# Patient Record
Sex: Female | Born: 1985 | Hispanic: Yes | Marital: Married | Smoking: Former smoker
Health system: Southern US, Community
[De-identification: ages and names within clinical notes are randomized; demographics above are authoritative.]

## PROBLEM LIST (undated history)

## (undated) DIAGNOSIS — F419 Anxiety disorder, unspecified: Secondary | ICD-10-CM

## (undated) DIAGNOSIS — F32A Depression, unspecified: Secondary | ICD-10-CM

## (undated) HISTORY — PX: NO PAST SURGERIES: SHX2092

---

## 2020-09-01 NOTE — L&D Delivery Note (Signed)
OB/GYN Faculty Practice Delivery Note  Sue Moore is a 35 y.o. G3P0020 s/p VD at [redacted]w[redacted]d. She was admitted for IOL d/t PostDates.   ROM: 7h 52m with clear fluid GBS Status: Negative Maximum Maternal Temperature: 99.9  Labor Progress: Patient admitted for induction and was given one dose of Cytotec before being started on pitocin.  She received an epidural and was AROM'd at 7.5 cm.  She then progressed to complete and delivered as below with staff and SO support.   Delivery Date/Time: August 20, 2021 at 0125 Delivery: Called to room and patient was complete and pushing. After 2 hours of pushing, head delivered in ROA with manual restitution to ROT. No nuchal cord present. Shoulder and body delivered in usual fashion. Infant without spontaneous cry, but good HR, tone, and color.  Infant dried and stimulated by provider before being placed on mother's abdomen where nurse's continued drying and stimulating. Cord clamped x 2 after 4-minute delay, and cut by father-Sue Moore. Cord blood drawn. Placenta delivered spontaneously with gentle cord traction. Fundus firm with massage and Pitocin. Labia, perineum, vagina, and cervix inspected and revealed a 2nd degree perineal laceration.  It was repaired with 3-0 vicryl on CT-1 and SH.  No additional anesthetic necessary and patient tolerated the procedure well. Fundus firm, at the umbilicus, and bleeding small.  Mother hemodynamically stable and infant skin to skin, with father, prior to provider exit.  Mother desires pills for birth control and opts to breastfeed.      Placenta: Intact, Shultz, Disposal Complications: None Lacerations: 2nd Degree Perineal EBL: Analgesia: Epidural  Infant: Female-Sue Moore 8, 9   3317g, 7lbs 5oz, 20in  Cherre Robins, CNM  08/20/2021 2:09 AM

## 2020-12-19 ENCOUNTER — Encounter (HOSPITAL_COMMUNITY): Payer: Self-pay | Admitting: *Deleted

## 2020-12-19 ENCOUNTER — Inpatient Hospital Stay (HOSPITAL_COMMUNITY)
Admission: AD | Admit: 2020-12-19 | Discharge: 2020-12-19 | Disposition: A | Discharge status: Home / Self Care | Payer: Self-pay | Attending: Obstetrics and Gynecology | Admitting: Obstetrics and Gynecology

## 2020-12-19 ENCOUNTER — Inpatient Hospital Stay (HOSPITAL_COMMUNITY): Payer: Self-pay

## 2020-12-19 DIAGNOSIS — Z758 Other problems related to medical facilities and other health care: Secondary | ICD-10-CM

## 2020-12-19 DIAGNOSIS — Z789 Other specified health status: Secondary | ICD-10-CM

## 2020-12-19 DIAGNOSIS — O99340 Other mental disorders complicating pregnancy, unspecified trimester: Secondary | ICD-10-CM | POA: Insufficient documentation

## 2020-12-19 DIAGNOSIS — Z3A01 Less than 8 weeks gestation of pregnancy: Secondary | ICD-10-CM | POA: Insufficient documentation

## 2020-12-19 DIAGNOSIS — F419 Anxiety disorder, unspecified: Secondary | ICD-10-CM | POA: Insufficient documentation

## 2020-12-19 DIAGNOSIS — Z6791 Unspecified blood type, Rh negative: Secondary | ICD-10-CM

## 2020-12-19 DIAGNOSIS — O99321 Drug use complicating pregnancy, first trimester: Secondary | ICD-10-CM | POA: Insufficient documentation

## 2020-12-19 DIAGNOSIS — F1290 Cannabis use, unspecified, uncomplicated: Secondary | ICD-10-CM

## 2020-12-19 DIAGNOSIS — Z87891 Personal history of nicotine dependence: Secondary | ICD-10-CM | POA: Insufficient documentation

## 2020-12-19 DIAGNOSIS — Z3A00 Weeks of gestation of pregnancy not specified: Secondary | ICD-10-CM

## 2020-12-19 DIAGNOSIS — O219 Vomiting of pregnancy, unspecified: Secondary | ICD-10-CM

## 2020-12-19 DIAGNOSIS — O3680X0 Pregnancy with inconclusive fetal viability, not applicable or unspecified: Secondary | ICD-10-CM

## 2020-12-19 DIAGNOSIS — O26899 Other specified pregnancy related conditions, unspecified trimester: Secondary | ICD-10-CM

## 2020-12-19 HISTORY — DX: Anxiety disorder, unspecified: F41.9

## 2020-12-19 HISTORY — DX: Depression, unspecified: F32.A

## 2020-12-19 LAB — URINALYSIS, ROUTINE W REFLEX MICROSCOPIC
Bilirubin Urine: NEGATIVE
Glucose, UA: NEGATIVE mg/dL
Hgb urine dipstick: NEGATIVE
Ketones, ur: 20 mg/dL — AB
Leukocytes,Ua: NEGATIVE
Nitrite: NEGATIVE
Protein, ur: NEGATIVE mg/dL
Specific Gravity, Urine: 1.006 (ref 1.005–1.030)
pH: 6 (ref 5.0–8.0)

## 2020-12-19 LAB — COMPREHENSIVE METABOLIC PANEL
ALT: 12 U/L (ref 0–44)
AST: 15 U/L (ref 15–41)
Albumin: 4.3 g/dL (ref 3.5–5.0)
Alkaline Phosphatase: 43 U/L (ref 38–126)
Anion gap: 10 (ref 5–15)
BUN: 8 mg/dL (ref 6–20)
CO2: 21 mmol/L — ABNORMAL LOW (ref 22–32)
Calcium: 9.2 mg/dL (ref 8.9–10.3)
Chloride: 105 mmol/L (ref 98–111)
Creatinine, Ser: 0.53 mg/dL (ref 0.44–1.00)
GFR, Estimated: >60 mL/min (ref >60)
Glucose, Bld: 89 mg/dL (ref 70–99)
Potassium: 3.5 mmol/L (ref 3.5–5.1)
Sodium: 136 mmol/L (ref 135–145)
Total Bilirubin: 1 mg/dL (ref 0.3–1.2)
Total Protein: 7.3 g/dL (ref 6.5–8.1)

## 2020-12-19 LAB — CBC
HCT: 39.6 % (ref 36.0–46.0)
Hemoglobin: 13.5 g/dL (ref 12.0–15.0)
MCH: 30.7 pg (ref 26.0–34.0)
MCHC: 34.1 g/dL (ref 30.0–36.0)
MCV: 90 fL (ref 80.0–100.0)
Platelets: 376 10*3/uL (ref 150–400)
RBC: 4.4 MIL/uL (ref 3.87–5.11)
RDW: 12.4 % (ref 11.5–15.5)
WBC: 11.7 10*3/uL — ABNORMAL HIGH (ref 4.0–10.5)
nRBC: 0 % (ref 0.0–0.2)

## 2020-12-19 LAB — HCG, QUANTITATIVE, PREGNANCY: hCG, Beta Chain, Quant, S: 13250 m[IU]/mL — ABNORMAL HIGH (ref <5)

## 2020-12-19 LAB — POCT PREGNANCY, URINE: Preg Test, Ur: POSITIVE — AB

## 2020-12-19 LAB — RAPID URINE DRUG SCREEN, HOSP PERFORMED
Amphetamines: NOT DETECTED
Barbiturates: NOT DETECTED
Benzodiazepines: NOT DETECTED
Cocaine: NOT DETECTED
Opiates: NOT DETECTED
Tetrahydrocannabinol: POSITIVE — AB

## 2020-12-19 LAB — ABO/RH: ABO/RH(D): AB NEG

## 2020-12-19 MED ORDER — LACTATED RINGERS IV BOLUS
1000.0000 mL | Freq: Once | INTRAVENOUS | Status: AC
  Administered 2020-12-19: 1000 mL via INTRAVENOUS

## 2020-12-19 MED ORDER — HYDROXYZINE HCL 25 MG PO TABS: 25.0000 mg | ORAL_TABLET | Freq: Four times a day (QID) | ORAL | 0 refills | Status: DC

## 2020-12-19 MED ORDER — PROMETHAZINE HCL 12.5 MG PO TABS: 12.5000 mg | ORAL_TABLET | Freq: Four times a day (QID) | ORAL | 0 refills | Status: DC | PRN

## 2020-12-19 MED ORDER — HYDROXYZINE HCL 50 MG/ML IM SOLN
25.0000 mg | INTRAMUSCULAR | Status: DC | PRN
  Administered 2020-12-19: 25 mg via INTRAMUSCULAR
  Filled 2020-12-19 (×3): qty 0.5

## 2020-12-19 NOTE — Discharge Instructions (Signed)
Sndrome de hipermesis cannabinoide Cannabinoid Hyperemesis Syndrome El sndrome de hipermesis cannabinoide [facility]) es una afeccin que causa nuseas, vmitos y dolor abdominal repetidos despus del consumo prolongado (crnico) de marihuana (cannabis). Las Dealer con SHC comnmente consumen marihuana de 3 a 5 veces por da, durante muchos aos, antes de presentar sntomas, aunque es posible Publishing copy SHC con un consumo diario Chief Operating Officer. Los sntomas del [facility] pueden ser leves al principio, pero pueden empeoran y volverse ms frecuentes. En algunos casos, el SHC puede causar vmitos intensos diarios, lo que puede provocar prdida de peso y deshidratacin. Cules son las causas? Se desconoce la causa exacta de esta afeccin. El uso prolongado de marihuana puede sobreestimular ciertas protenas en el cerebro y el tracto gastrointestinal que reaccionan con sustancias qumicas presentes en la marihuana (receptores cannabinoides). Esta sobreestimulacin puede ser la causa del [facility]. Cules son los signos o sntomas? Los sntomas de Copy suelen ser leves durante los primeros episodios, pero pueden empeorar con el [place]. Entre los sntomas, se pueden incluir los siguientes:  Nuseas frecuentes, Public librarian las primeras horas de la [place].  Vmitos. Estos pueden volverse intensos.  Dolor abdominal.  Sensacin de mucho cansancio (letargo).  Dolores de [place]. El [facility] puede desaparecer y volver a Biomedical engineer (ser recurrente). Las personas pueden no tener sntomas o bien pueden estar saludables entre episodios del [facility]. Tomar duchas calientes puede [employer] sntomas del SHC, por lo que sentir la necesidad de tomar varias duchas calientes durante el da puede ser un signo de esta afeccin. Cmo se diagnostica? Esta afeccin se puede diagnosticar en funcin de lo siguiente:  Los sntomas y antecedentes mdicos, incluido el consumo de drogas.  Un examen fsico. Es posible  que le hagan estudios para descartar otros problemas que podran ser la causa de los sntomas. Estas pruebas pueden incluir lo siguiente:  Anlisis de sangre.  Anlisis de [place].  Estudios de diagnstico por imgenes, como una radiografa o una exploracin por tomografa computarizada (TC). Cmo se trata? El tratamiento de esta afeccin incluye interrumpir el consumo de marihuana. El tratamiento puede incluir:  Un programa de rehabilitacin de la drogadiccin, si tiene problemas para dejar el consumo de marihuana.  Medicamentos para las nuseas. Estos pueden administrarse en el hospital a travs de una va intravenosa (i.v.) introducida en una vena, o bien pueden ser medicamentos que se toman por boca (va oral).  Ciertas cremas que contienen una sustancia llamada capsaicina. Estas pueden mejorar los sntomas cuando se aplican en el abdomen.  Duchas calientes para [employer]. En los casos intensos, puede ser necesario recibir tratamiento en un hospital. Es posible que le administren lquidos intravenosos (i.v.) para prevenir o Medical illustrator, as como medicamentos para tratar las nuseas, los vmitos y Chief Technology Officer. Siga estas instrucciones en su casa: Durante un episodio de [facility]  Permanezca en la cama y descanse en una habitacin oscura y tranquila.  Tome los medicamentos para las nuseas como se lo haya indicado el mdico.  Trate de tomar duchas calientes para [employer] sntomas.   Despus de un episodio de SHC  Beba pequeas cantidades de lquidos claros lentamente. Aumente gradualmente la cantidad si puede retener los lquidos sin vomitar.  Una vez que pueda comer sin vomitar, coma alimentos blandos en pequeas cantidades cada 3 o 4 horas. Instrucciones generales  No consuma ningn producto que contenga marihuana.Si necesita ayuda para dejar de consumir marihuana, consulte al [employer] recursos y opciones de [place].  Beba suficiente  lquido como para  Pharmacologist la orina de color amarillo plido. Evite consumir lquidos que contengan mucha azcar o cafena, como caf y bebidas gaseosas.  Tome y [employer] medicamentos de venta libre y los recetados solamente como se lo haya indicado el mdico. Consulte al mdico antes de Corporate investment banker a Dietitian o de iniciar un tratamiento nuevo.  Concurra a todas las visitas de [address] se lo haya indicado el mdico. Esto es importante. Esto incluye los programas recomendados para el abuso de sustancias.   Comunquese con un mdico si:  Sus sntomas empeoran.  No puede beber lquidos sin vomitar o sentir dolor intenso.  Siente dolor y tiene problemas para tragar despus de un episodio. Busque ayuda de inmediato si:  No puede parar de vomitar.  Vomita sangre o el vmito es parecido a los granos de caf.  Siente un dolor abdominal intenso.  Tiene heces con sangre o que son de color negro, o tienen aspecto alquitranado.  Tiene sntomas de deshidratacin, por ejemplo: ? Ojos hundidos. ? Incapacidad de producir lgrimas. ? Labios agrietados. ? Sequedad de boca. ? Disminucin de la produccin de [place]. ? Debilidad. ? Somnolencia. ? Mareos, sensacin de desvanecimiento o desmayos. Resumen  El sndrome de hipermesis cannabinoide [facility]) es una afeccin que causa nuseas, vmitos y dolor abdominal repetidos despus del consumo prolongado de marihuana.  Las Dealer con SHC comnmente consumen marihuana de 3 a 5 veces por da, durante muchos aos, antes de presentar sntomas, aunque es posible Publishing copy SHC con un consumo diario Chief Operating Officer.  El tratamiento de esta afeccin incluye interrumpir el consumo de marihuana. Las duchas calientes y las cremas con capsaicina tambin pueden ayudar a Paramedic los sntomas. Consulte al mdico antes de empezar a usar cualquier [employer] o de iniciar otros tratamientos.  El mdico podr recetarle medicamentos para [employer]  nuseas.  Solicite ayuda de inmediato si tiene signos de deshidratacin, como sequedad en la boca, disminucin de la produccin de [place], [place], [place] y aturdimiento. Esta informacin no tiene Theme park manager el consejo del mdico. Asegrese de hacerle al mdico cualquier pregunta que tenga. Document Revised: 08/31/2019 Document Reviewed: 08/31/2019 Elsevier Patient Education  2021 [employer].

## 2020-12-19 NOTE — MAU Provider Note (Signed)
History     CSN: [id number]  Arrival date and time: 12/19/20 1147   Event Date/Time   First Provider Initiated Contact with Patient 12/19/20 [phone]      Chief Complaint  Patient presents with  . Anxiety  . Depression  . Emesis  . Possible Pregnancy   HPI Sue Moore is a 35 y.o. G3P0020 at Unknown who presents to MAU with chief complaints of anxiety and abdominal pain. Her abdominal pain started last week. Her anxiety is chronic. Patient states her anxiety is currently so severe she cannot tell if her pain is in her abdomen or from her anxiety. She denies ever taking medication for her anxiety.  Patient also experiences lack of appetite and intermittent vomiting related to high anxiety. She previously managed this by smoking THC, but discontinued this when she had a positive pregnancy test last week. She has instead started using a Delta 8 pen but states her vomiting is worse.   Patient denies vaginal bleeding, dysuria, fever or recent illness.   OB History    Gravida  3   Para      Term      Preterm      AB  2   Living        SAB  2   IAB      Ectopic      Multiple      Live Births              Past Medical History:  Diagnosis Date  . Anxiety   . Depression     Past Surgical History:  Procedure Laterality Date  . NO PAST SURGERIES      History reviewed. No pertinent family history.  Social History   Tobacco Use  . Smoking status: Former Smoker  Substance Use Topics  . Alcohol use: Not Currently  . Drug use: Not Currently    Types: Marijuana    Comment: was daily user in [place]    Allergies: No Known Allergies  No medications prior to admission.    Review of Systems  Constitutional: Positive for appetite change and fatigue.  Respiratory: Negative for shortness of breath.   Cardiovascular: Negative for palpitations.  Gastrointestinal: Positive for abdominal pain.  Psychiatric/Behavioral: The patient is  nervous/anxious.   All other systems reviewed and are negative.  Physical Exam   Blood pressure 123/78, pulse (!) 107, temperature 98.6 F (37 C), temperature source Oral, resp. rate (!) 22, height 4' 11.06" (1.5 m), weight 49.8 kg, last menstrual period 11/21/2020, SpO2 100 %.  Physical Exam Vitals and nursing note reviewed. Exam conducted with a chaperone present.  Constitutional:      General: She is in acute distress.     Appearance: Normal appearance.  Cardiovascular:     Rate and Rhythm: Tachycardia present.     Pulses: Normal pulses.     Heart sounds: Normal heart sounds.  Pulmonary:     Effort: Pulmonary effort is normal.     Breath sounds: Normal breath sounds.  Abdominal:     General: Abdomen is flat. Bowel sounds are normal.     Tenderness: There is no abdominal tenderness. There is no right CVA tenderness or left CVA tenderness.  Skin:    Capillary Refill: Capillary refill takes less than 2 seconds.  Neurological:     Mental Status: She is alert and oriented to person, place, and time.  Psychiatric:        Mood and Affect: Mood  normal.        Behavior: Behavior normal.        Thought Content: Thought content normal.        Judgment: Judgment normal.     MAU Course  Procedures  --Patient assessed prior to arrival of husband. Extremely agitated, shaking in bed but A&) x 4, participating in care and responding appropriately. Denies SI, HI, IPV  Orders Placed This Encounter  Procedures  . US OB LESS THAN 14 WEEKS WITH OB TRANSVAGINAL  . Urinalysis, Routine w reflex microscopic Urine, Clean Catch  . CBC  . Comprehensive metabolic panel  . hCG, quantitative, pregnancy  . Rapid urine drug screen (hospital performed)  . Pregnancy, urine POC  . ABO/Rh  . Insert peripheral IV  . Discharge patient   Patient Vitals for the past 24 hrs:  BP Temp Temp src Pulse Resp SpO2 Height Weight  12/19/20 1549 (!) 106/55 98.4 F (36.9 C) Oral 86 17 97 % -- --  12/19/20  1209 123/78 98.6 F (37 C) Oral (!) 107 (!) 22 100 % 4' 11.06" (1.5 m) 49.8 kg   Results for orders placed or performed during the hospital encounter of 12/19/20 (from the past 24 hour(s))  Pregnancy, urine POC     Status: Abnormal   Collection Time: 12/19/20 12:16 PM  Result Value Ref Range   Preg Test, Ur POSITIVE (A) NEGATIVE  Urinalysis, Routine w reflex microscopic Urine, Clean Catch     Status: Abnormal   Collection Time: 12/19/20 12:21 PM  Result Value Ref Range   Color, Urine YELLOW YELLOW   APPearance HAZY (A) CLEAR   Specific Gravity, Urine 1.006 1.005 - 1.030   pH 6.0 5.0 - 8.0   Glucose, UA NEGATIVE NEGATIVE mg/dL   Hgb urine dipstick NEGATIVE NEGATIVE   Bilirubin Urine NEGATIVE NEGATIVE   Ketones, ur 20 (A) NEGATIVE mg/dL   Protein, ur NEGATIVE NEGATIVE mg/dL   Nitrite NEGATIVE NEGATIVE   Leukocytes,Ua NEGATIVE NEGATIVE  CBC     Status: Abnormal   Collection Time: 12/19/20 12:42 PM  Result Value Ref Range   WBC 11.7 (H) 4.0 - 10.5 K/uL   RBC 4.40 3.87 - 5.11 MIL/uL   Hemoglobin 13.5 12.0 - 15.0 g/dL   HCT 33.0 07.6 - 22.6 %   MCV 90.0 80.0 - 100.0 fL   MCH 30.7 26.0 - 34.0 pg   MCHC 34.1 30.0 - 36.0 g/dL   RDW 33.3 54.5 - 62.5 %   Platelets 376 150 - 400 K/uL   nRBC 0.0 0.0 - 0.2 %  Comprehensive metabolic panel     Status: Abnormal   Collection Time: 12/19/20 12:42 PM  Result Value Ref Range   Sodium 136 135 - 145 mmol/L   Potassium 3.5 3.5 - 5.1 mmol/L   Chloride 105 98 - 111 mmol/L   CO2 21 (L) 22 - 32 mmol/L   Glucose, Bld 89 70 - 99 mg/dL   BUN 8 6 - 20 mg/dL   Creatinine, Ser 6.38 0.44 - 1.00 mg/dL   Calcium 9.2 8.9 - 93.7 mg/dL   Total Protein 7.3 6.5 - 8.1 g/dL   Albumin 4.3 3.5 - 5.0 g/dL   AST 15 15 - 41 U/L   ALT 12 0 - 44 U/L   Alkaline Phosphatase 43 38 - 126 U/L   Total Bilirubin 1.0 0.3 - 1.2 mg/dL   GFR, Estimated >34 >28 mL/min   Anion gap 10 5 - 15  ABO/Rh  Status: None   Collection Time: 12/19/20 12:42 PM  Result Value Ref  Range   ABO/RH(D)      AB NEG Performed at [facility], [address] [place], [place] [address]   hCG, quantitative, pregnancy     Status: Abnormal   Collection Time: 12/19/20 12:42 PM  Result Value Ref Range   hCG, Beta Chain, Quant, S 13,250 (H) <5 mIU/mL  Rapid urine drug screen (hospital performed)     Status: Abnormal   Collection Time: 12/19/20  2:37 PM  Result Value Ref Range   Opiates NONE DETECTED NONE DETECTED   Cocaine NONE DETECTED NONE DETECTED   Benzodiazepines NONE DETECTED NONE DETECTED   Amphetamines NONE DETECTED NONE DETECTED   Tetrahydrocannabinol POSITIVE (A) NONE DETECTED   Barbiturates NONE DETECTED NONE DETECTED   US OB LESS THAN 14 WEEKS WITH OB TRANSVAGINAL  Result Date: 12/19/2020 CLINICAL DATA:  Pregnancy of unknown anatomic location, LMP 11/21/2020 EXAM: OBSTETRIC <14 WK [place] AND TRANSVAGINAL OB US TECHNIQUE: Both transabdominal and transvaginal ultrasound examinations were performed for complete evaluation of the gestation as well as the maternal uterus, adnexal regions, and pelvic cul-de-sac. Transvaginal technique was performed to assess early pregnancy. COMPARISON:  None FINDINGS: Intrauterine gestational sac: Present, single Yolk sac:  Not identified Embryo:  Not identified Cardiac Activity: N/A Heart Rate: N/A  bpm MSD: 6.6 mm   5 w   2 d Subchorionic hemorrhage:  None visualized. Maternal uterus/adnexae: Remainder of uterus unremarkable. RIGHT ovary normal size and morphology 3.4 x 2.2 x 1.8 cm. LEFT ovary normal size and morphology 3.7 x 2.1 x 2.3 cm. Small amount of free pelvic fluid in cul-de-sac. No adnexal masses. IMPRESSION: Gestational sac within the uterus. No fetal pole identified to establish viability; may consider follow-up ultrasound in 14 days to establish viability if clinically indicated. Small amount of nonspecific free pelvic fluid without evidence of adnexal mass. Electronically Signed   By: Ulyses Southward M.D.   On: 12/19/2020 15:05    Meds ordered this encounter  Medications  . lactated ringers bolus 1,000 mL  . hydrOXYzine (VISTARIL) injection 25 mg  . hydrOXYzine (ATARAX/VISTARIL) 25 MG tablet    Sig: Take 1 tablet (25 mg total) by mouth every 6 (six) hours.    Dispense:  12 tablet    Refill:  0    Order Specific Question:   Supervising Provider    Answer:   ERVIN, MICHAEL L [[id number]]  . promethazine (PHENERGAN) 12.5 MG tablet    Sig: Take 1 tablet (12.5 mg total) by mouth every 6 (six) hours as needed for nausea or vomiting.    Dispense:  30 tablet    Refill:  0    Order Specific Question:   Supervising Provider    Answer:   Nettie Elm L [[id number]]    Assessment and Plan  --35 y.o. [id number] with PUL --Anxiety improved with Vistaril --+ THC --Given GoodRx printout to reduce out of pocket cost  --Declined SW consult, TTS --Language barrier: in-person interpreters utilized for all patient interaction --Discharge home in stable condition  F/U: --Stat Quant hCG in 48 hours. Self pay and so will accomplish in MAU  Calvert Cantor, CNM 12/19/2020, 7:07 PM

## 2020-12-19 NOTE — MAU Note (Signed)
She is preg, having a lot of anxiety, depression and is not feeling.  Is not able to eat because of stress, has been vomiting. Is very tense. Is unsure if she is really having pain or if it just from tension. In [place], was smoking marijuana to treat anxiety and depression.  Came to [place] in Oct.  Has been on similar medication  (adelpha 8). Stopped using when found out pregnant. (last wk).  3rd preg- has had 2 prior miscarriages.

## 2020-12-24 ENCOUNTER — Telehealth: Payer: Self-pay | Admitting: Women's Health

## 2020-12-24 NOTE — Telephone Encounter (Signed)
Attempted to call patient with Spanish interpreter for missed hCG visit on 12/21/2020. Phone rang, no answer, voicemail message with female voice and name played, no message left. Patient does not have MyChart for messaging.  Marylen Ponto, NP  1:18 PM 12/24/2020

## 2021-02-07 ENCOUNTER — Other Ambulatory Visit: Payer: Self-pay

## 2021-02-07 ENCOUNTER — Ambulatory Visit (INDEPENDENT_AMBULATORY_CARE_PROVIDER_SITE_OTHER): Payer: BC Managed Care – PPO

## 2021-02-07 ENCOUNTER — Encounter: Payer: Self-pay | Admitting: Obstetrics and Gynecology

## 2021-02-07 ENCOUNTER — Other Ambulatory Visit (HOSPITAL_COMMUNITY)
Admission: RE | Admit: 2021-02-07 | Discharge: 2021-02-07 | Disposition: A | Discharge status: Home / Self Care | Payer: BC Managed Care – PPO | Source: Ambulatory Visit | Attending: Obstetrics and Gynecology | Admitting: Obstetrics and Gynecology

## 2021-02-07 ENCOUNTER — Ambulatory Visit (INDEPENDENT_AMBULATORY_CARE_PROVIDER_SITE_OTHER): Payer: BC Managed Care – PPO | Admitting: Obstetrics and Gynecology

## 2021-02-07 VITALS — BP 91/56 | HR 64 | Wt 110.0 lb

## 2021-02-07 DIAGNOSIS — O3680X0 Pregnancy with inconclusive fetal viability, not applicable or unspecified: Secondary | ICD-10-CM

## 2021-02-07 DIAGNOSIS — Z3A12 12 weeks gestation of pregnancy: Secondary | ICD-10-CM

## 2021-02-07 DIAGNOSIS — Z3481 Encounter for supervision of other normal pregnancy, first trimester: Secondary | ICD-10-CM | POA: Diagnosis not present

## 2021-02-07 DIAGNOSIS — O0990 Supervision of high risk pregnancy, unspecified, unspecified trimester: Secondary | ICD-10-CM | POA: Insufficient documentation

## 2021-02-07 DIAGNOSIS — Z789 Other specified health status: Secondary | ICD-10-CM

## 2021-02-07 DIAGNOSIS — Z3401 Encounter for supervision of normal first pregnancy, first trimester: Secondary | ICD-10-CM

## 2021-02-07 DIAGNOSIS — F1290 Cannabis use, unspecified, uncomplicated: Secondary | ICD-10-CM

## 2021-02-07 DIAGNOSIS — F32A Depression, unspecified: Secondary | ICD-10-CM

## 2021-02-07 DIAGNOSIS — O0992 Supervision of high risk pregnancy, unspecified, second trimester: Secondary | ICD-10-CM | POA: Diagnosis not present

## 2021-02-07 DIAGNOSIS — O09521 Supervision of elderly multigravida, first trimester: Secondary | ICD-10-CM

## 2021-02-07 DIAGNOSIS — O26891 Other specified pregnancy related conditions, first trimester: Secondary | ICD-10-CM

## 2021-02-07 DIAGNOSIS — R630 Anorexia: Secondary | ICD-10-CM

## 2021-02-07 DIAGNOSIS — F419 Anxiety disorder, unspecified: Secondary | ICD-10-CM

## 2021-02-07 DIAGNOSIS — Z6791 Unspecified blood type, Rh negative: Secondary | ICD-10-CM

## 2021-02-07 DIAGNOSIS — F502 Bulimia nervosa: Secondary | ICD-10-CM

## 2021-02-07 NOTE — Progress Notes (Signed)
Last 2019-states was positive HPV   Patient informed that the ultrasound is considered a limited obstetric ultrasound and is not intended to be a complete ultrasound exam.  Patient also informed that the ultrasound is not being completed with the intent of assessing for fetal or placental anomalies or any pelvic abnormalities. Explained that the purpose of today's ultrasound is to assess for viability.  Patient acknowledges the purpose of the exam and the limitations of the study.

## 2021-02-08 ENCOUNTER — Encounter: Payer: Self-pay | Admitting: Obstetrics and Gynecology

## 2021-02-08 DIAGNOSIS — F32A Depression, unspecified: Secondary | ICD-10-CM | POA: Insufficient documentation

## 2021-02-08 DIAGNOSIS — R630 Anorexia: Secondary | ICD-10-CM | POA: Insufficient documentation

## 2021-02-08 DIAGNOSIS — F502 Bulimia nervosa: Secondary | ICD-10-CM | POA: Insufficient documentation

## 2021-02-08 DIAGNOSIS — O09523 Supervision of elderly multigravida, third trimester: Secondary | ICD-10-CM | POA: Insufficient documentation

## 2021-02-08 LAB — CYTOLOGY - PAP
Chlamydia: NEGATIVE
Comment: NEGATIVE
Comment: NEGATIVE
Comment: NORMAL
Diagnosis: NEGATIVE
High risk HPV: NEGATIVE
Neisseria Gonorrhea: NEGATIVE

## 2021-02-08 MED ORDER — DOXYLAMINE-PYRIDOXINE 10-10 MG PO TBEC: 2.0000 | DELAYED_RELEASE_TABLET | Freq: Every day | ORAL | 5 refills | Status: DC

## 2021-02-08 NOTE — Progress Notes (Signed)
New OB Note  02/07/2021   Clinic: [facility]  Chief Complaint: New OB  Transfer of Care Patient: no  History of Present Illness: Ms. Bale is a 35 y.o. G3P0020 @ 12/4 weeks (EDC 12/18, based on Patient's last menstrual period was 11/11/2020.=12wk u/).  Preg complicated by has Rh negative status during pregnancy; Marijuana use; Pregnancy of unknown anatomic location; Language barrier affecting health care; and Encounter for supervision of normal first pregnancy in first trimester on their problem list.   She has Negative signs or symptoms of nausea/vomiting of pregnancy. She has Negative signs or symptoms of miscarriage or preterm labor  ROS: A 12-point review of systems was performed and negative, except as stated in the above HPI.  OBGYN History: As per HPI. OB History  Gravida Para Term Preterm AB Living  3       2    SAB IAB Ectopic Multiple Live Births  2            # Outcome Date GA Lbr Len/2nd Weight Sex Delivery Anes PTL Lv  3 Current           2 SAB 2016          1 SAB 2011       Y      Past Medical History: Past Medical History:  Diagnosis Date   Anxiety    Depression     Past Surgical History: Past Surgical History:  Procedure Laterality Date   NO PAST SURGERIES      Family History:  History reviewed. No pertinent family history.  Social History:  Social History   Socioeconomic History   Marital status: Married    Spouse name: Not on file   Number of children: Not on file   Years of education: Not on file   Highest education level: Not on file  Occupational History   Not on file  Tobacco Use   Smoking status: Former    Pack years: 0.00   Smokeless tobacco: Not on file  Vaping Use   Vaping Use: Not on file  Substance and Sexual Activity   Alcohol use: Not Currently   Drug use: Not Currently    Types: Marijuana    Comment: was daily user in [place]   Sexual activity: Yes  Other Topics Concern   Not on  file  Social History Narrative   Not on file   Social Determinants of Health   Financial Resource Strain: Not on file  Food Insecurity: Not on file  Transportation Needs: Not on file  Physical Activity: Not on file  Stress: Not on file  Social Connections: Not on file  Intimate Partner Violence: Not on file    Allergy: No Known Allergies   Current Outpatient Medications: Prenatal vitamins, phenergan  Physical Exam:   BP (!) 91/56   Pulse 64   Wt 110 lb (49.9 kg)   LMP 11/11/2020   BMI 22.18 kg/m  Body mass index is 22.18 kg/m. Contractions: Not present Vag. Bleeding: None. FHTs: 150s  General: no acute distress Cardiovascular: S1, S2 normal, no murmur, rub or gallop, regular rate and rhythm Respiratory:  Clear to auscultation bilateral. Normal respiratory effort Abdomen: positive bowel sounds and no masses, hernias; diffusely non tender to palpation, non distended Breasts: breasts appear normal, no suspicious masses, no skin or nipple changes or axillary nodes, and normal palpation. Neuro/Psych:  Normal mood and affect.  Skin:  Warm and dry.  Lymphatic:  No  inguinal lymphadenopathy.   Pelvic exam: is not limited by body habitus EGBUS: within normal limits, Vagina: within normal limits and with no blood in the vault, Cervix: normal appearing cervix without discharge or lesions, closed/long/high, Uterus:  enlarged, c/w 12-14 week size, and Adnexa:  normal adnexa and no mass, fullness, tenderness  Laboratory: No new labs  Imaging:  Bedside u/s with SLIUP at 12/3 based on CRL   Assessment: pt doing well  Plan: 1. Pregnancy of unknown anatomic location - US OB Limited; Future  2. Encounter for supervision of normal first pregnancy in first trimester  3. Multigravida of advanced maternal age in first trimester Pt amenable to genetics. Cffdna today. Offer afp nv  4. Rh negative status during pregnancy in first trimester Rhogam at 28wks and PP PRN  5.  Marijuana use counseled  6. Language barrier affecting health care Interpreter used  7. Supervision of high risk pregnancy in second trimester Routine care - Cytology - PAP - Culture, OB Urine - Genetic Screening - US MFM OB DETAIL +14 WK; Future - CBC/D/Plt+RPR+Rh+ABO+RubIgG... - TSH - Hemoglobin A1c - Comprehensive metabolic panel  8. Anxiety disorder, unspecified type Doing well. Continue to follow closely. Refer to [facility] PRN  Problem list reviewed and updated.  Follow up in 3 weeks.  The nature of [facility] with multiple MDs and other Advanced Practice Providers was explained to patient; also emphasized that residents, students are part of our team.  >50% of 30 min visit spent on counseling and coordination of care.     Cornelia Copa MD Attending Center for [facility])

## 2021-02-09 LAB — CBC/D/PLT+RPR+RH+ABO+RUBIGG...
Antibody Screen: NEGATIVE
Basophils Absolute: 0 10*3/uL (ref 0.0–0.2)
Basos: 0 %
EOS (ABSOLUTE): 0.1 10*3/uL (ref 0.0–0.4)
Eos: 1 %
HCV Ab: <0.1 s/co ratio (ref 0.0–0.9)
HIV Screen 4th Generation wRfx: NONREACTIVE
Hematocrit: 35.3 % (ref 34.0–46.6)
Hemoglobin: 12.6 g/dL (ref 11.1–15.9)
Hepatitis B Surface Ag: NEGATIVE
Immature Grans (Abs): 0.1 10*3/uL (ref 0.0–0.1)
Immature Granulocytes: 1 %
Lymphocytes Absolute: 1.8 10*3/uL (ref 0.7–3.1)
Lymphs: 19 %
MCH: 32 pg (ref 26.6–33.0)
MCHC: 35.7 g/dL (ref 31.5–35.7)
MCV: 90 fL (ref 79–97)
Monocytes Absolute: 0.4 10*3/uL (ref 0.1–0.9)
Monocytes: 4 %
Neutrophils Absolute: 7.4 10*3/uL — ABNORMAL HIGH (ref 1.4–7.0)
Neutrophils: 75 %
Platelets: 330 10*3/uL (ref 150–450)
RBC: 3.94 x10E6/uL (ref 3.77–5.28)
RDW: 13.5 % (ref 11.7–15.4)
RPR Ser Ql: NONREACTIVE
Rh Factor: NEGATIVE
Rubella Antibodies, IGG: 3.13 index (ref >0.99)
WBC: 9.8 10*3/uL (ref 3.4–10.8)

## 2021-02-09 LAB — COMPREHENSIVE METABOLIC PANEL
ALT: 14 IU/L (ref 0–32)
AST: 14 IU/L (ref 0–40)
Albumin/Globulin Ratio: 1.6 (ref 1.2–2.2)
Albumin: 4.6 g/dL (ref 3.8–4.8)
Alkaline Phosphatase: 49 IU/L (ref 44–121)
BUN/Creatinine Ratio: 10 (ref 9–23)
BUN: 6 mg/dL (ref 6–20)
Bilirubin Total: 0.5 mg/dL (ref 0.0–1.2)
CO2: 19 mmol/L — ABNORMAL LOW (ref 20–29)
Calcium: 10.2 mg/dL (ref 8.7–10.2)
Chloride: 102 mmol/L (ref 96–106)
Creatinine, Ser: 0.59 mg/dL (ref 0.57–1.00)
Globulin, Total: 2.9 g/dL (ref 1.5–4.5)
Glucose: 82 mg/dL (ref 65–99)
Potassium: 4.4 mmol/L (ref 3.5–5.2)
Sodium: 138 mmol/L (ref 134–144)
Total Protein: 7.5 g/dL (ref 6.0–8.5)
eGFR: 120 mL/min/{1.73_m2} (ref >59)

## 2021-02-09 LAB — URINE CULTURE, OB REFLEX

## 2021-02-09 LAB — HEMOGLOBIN A1C
Est. average glucose Bld gHb Est-mCnc: 97 mg/dL
Hgb A1c MFr Bld: 5 % (ref 4.8–5.6)

## 2021-02-09 LAB — CULTURE, OB URINE

## 2021-02-09 LAB — HCV INTERPRETATION

## 2021-02-09 LAB — TSH: TSH: 1.29 u[IU]/mL (ref 0.450–4.500)

## 2021-02-18 ENCOUNTER — Encounter: Payer: Self-pay | Admitting: Radiology

## 2021-02-18 ENCOUNTER — Telehealth: Payer: Self-pay | Admitting: Radiology

## 2021-02-18 NOTE — Telephone Encounter (Signed)
Patient was informed of Panorama and Horizon results including fetal sex

## 2021-02-28 ENCOUNTER — Ambulatory Visit (INDEPENDENT_AMBULATORY_CARE_PROVIDER_SITE_OTHER): Payer: BC Managed Care – PPO | Admitting: Family Medicine

## 2021-02-28 ENCOUNTER — Other Ambulatory Visit: Payer: Self-pay

## 2021-02-28 VITALS — BP 89/55 | HR 64 | Wt 111.0 lb

## 2021-02-28 DIAGNOSIS — Z789 Other specified health status: Secondary | ICD-10-CM

## 2021-02-28 DIAGNOSIS — O09521 Supervision of elderly multigravida, first trimester: Secondary | ICD-10-CM

## 2021-02-28 DIAGNOSIS — O0990 Supervision of high risk pregnancy, unspecified, unspecified trimester: Secondary | ICD-10-CM

## 2021-02-28 DIAGNOSIS — O219 Vomiting of pregnancy, unspecified: Secondary | ICD-10-CM

## 2021-02-28 MED ORDER — SCOPOLAMINE 1 MG/3DAYS TD PT72: 1.0000 | MEDICATED_PATCH | TRANSDERMAL | 12 refills | Status: DC

## 2021-02-28 MED ORDER — PRENATAL 27-0.8 MG PO TABS: 1.0000 | ORAL_TABLET | Freq: Every day | ORAL | 1 refills | Status: DC

## 2021-02-28 NOTE — Patient Instructions (Signed)
Segundo trimestre de embarazo °Second Trimester of Pregnancy °El segundo trimestre de embarazo va desde la semana 13 hasta la semana 27. Es decir desde el mes 4 hasta el mes 6 de embarazo. El segundo trimestre suele ser el momento en el que mejor se siente. Su organismo se ha adaptado a estar embarazada, y comienza a sentirse físicamente mejor. °Durante el segundo trimestre: °Las náuseas del embarazo han disminuido o han desaparecido completamente. °Usted puede tener más energía. °Es posible que tenga un aumento del apetito. °El segundo trimestre es también un período en el que el bebé en gestación (feto) crece rápidamente. Hacia el final del sexto mes, el feto puede medir aproximadamente 12 pulgadas y pesar alrededor de 1½ libras. Es probable que sienta que el bebé se mueve (da pataditas) entre las 16 y 20 semanas del embarazo. °Cambios en el cuerpo durante el segundo trimestre °Su cuerpo continua experimentando numerosos cambios durante su segundo trimestre. Los cambios varían y generalmente vuelven a la normalidad después del nacimiento del bebé. °Cambios físicos °Seguirá aumentando de peso. Notará que la parte baja del abdomen sobresale. °Podrán aparecer las primeras estrías en las caderas, el abdomen y las mamas. °Las mamas seguirán creciendo y se tornarán sensibles. °Pueden aparecer zonas oscuras o manchas (cloasma o máscara del embarazo) en el rostro. °Es posible que se forme una línea oscura desde el ombligo hasta la zona del pubis (linea nigra). °Tal vez haya cambios en el cabello. Esto cambios pueden incluir su engrosamiento, crecimiento rápido y cambios en la textura. A algunas personas también se les cae el cabello durante o después del embarazo, o tienen el cabello seco o fino. °Cambios en la salud °Comienza a tener dolores de cabeza. °Es posible que tenga acidez estomacal. °Puede tener estreñimiento. °Pueden aparecer hemorroides o abultarse e hincharse las venas (venas varicosas). °Las encías pueden  sangrar y estar sensibles al cepillado y al hilo dental. °Tal vez tenga necesidad de orinar con más frecuencia porque el feto está ejerciendo presión sobre la vejiga. °Puede sentir dolor en la espalda. Esto se debe a: °Aumento de peso. °Las hormonas del embarazo relajan las articulaciones en la pelvis. °Un cambio en el peso y los músculos que ayudan a mantener su equilibrio. °Siga estas instrucciones en su casa: °Medicamentos °Siga las instrucciones del médico en relación con el uso de medicamentos. Durante el embarazo, hay medicamentos que pueden tomarse y otros que no. No tome medicamentos a menos que lo haya autorizado el médico. °Tome vitaminas prenatales que contengan por lo menos 600 microgramos (mcg) de ácido fólico. °Comida y bebida °Lleve una dieta saludable que incluya frutas y verduras frescas, cereales integrales, buenas fuentes de proteínas como carnes magras, huevos o tofu, y productos lácteos descremados. °Evite la carne cruda y el jugo, la leche y el queso sin pasteurizar. Estos portan gérmenes que pueden provocar daño tanto a usted como al bebé. °Es posible que tenga que tomar estas medidas para prevenir o tratar el estreñimiento: °Beber suficiente líquido como para mantener la orina de color amarillo pálido. °Consumir alimentos ricos en fibra, como frijoles, cereales integrales, y frutas y verduras frescas. °Limitar el consumo de alimentos ricos en grasa y azúcares procesados, como los alimentos fritos o dulces. °Actividad °Haga ejercicio solamente como se lo haya indicado el médico. La mayoría de las personas pueden continuar su rutina de ejercicios durante el embarazo. Intente realizar como mínimo 30 minutos de actividad física por lo menos 5 días a la semana. Deje de hacer ejercicio si comienza a   tener contracciones en el útero. °Deje de hacer ejercicio si le aparecen dolor o cólicos en la parte baja del vientre o de la espalda. °Evite hacer ejercicio si hace mucho calor o humedad, o si se  encuentra a una altitud elevada. °Evite levantar pesos excesivos. °Si lo desea, puede seguir teniendo relaciones sexuales, salvo que el médico le indique lo contrario. °Alivio del dolor y del malestar °Use un sujetador que le brinde buen soporte para prevenir las molestias causadas por la sensibilidad en las mamas. °Dese baños de asiento con agua tibia para aliviar el dolor o las molestias causadas por las hemorroides. Use una crema para las hemorroides si el médico la autoriza. °Descanse con las piernas levantadas (elevadas) si tiene calambres en las piernas o dolor en la parte baja de la espalda. °Si tiene venas varicosas: °Use medias de compresión como se lo haya indicado el médico. °Eleve los pies durante 15 minutos, 3 o 4 veces por día. °Limite el consumo de sal en su dieta. °Seguridad °Use el cinturón de seguridad en todo momento mientras conduce o va en auto. °Hable con el médico si es víctima de maltrato verbal o físico. °Estilo de vida °No se dé baños de inmersión en agua caliente, baños turcos ni saunas. °No se haga duchas vaginales. No use tampones ni toallas higiénicas perfumadas. °Evite el contacto con las bandejas sanitarias de los gatos y la tierra que estos animales usan. Estos elementos contienen bacterias que pueden causar defectos congénitos al bebé y la posible pérdida del feto debido a un aborto espontáneo o muerte fetal. °No use remedios a base de hierbas, alcohol, drogas ilegales ni medicamentos que no estén aprobados por el médico. Las sustancias químicas de estos productos pueden dañar al bebé. °No consuma ningún producto que contenga nicotina o tabaco, como cigarrillos, cigarrillos electrónicos y tabaco de mascar. Si necesita ayuda para dejar de fumar, consulte al médico. °Instrucciones generales °Durante una visita prenatal de rutina, el médico le hará un examen físico y otras pruebas. También le hablará sobre su salud general. Cumpla con todas las visitas de seguimiento. Esto es  importante. °Pídale al médico que la derive a clases de educación prenatal en su localidad. °Pida ayuda si tiene necesidades nutricionales o de asesoramiento durante el embarazo. El médico puede aconsejarla o derivarla a especialistas para que la ayuden con diferentes necesidades. °Dónde buscar más información °American Pregnancy Association (Asociación Americana del Embarazo): americanpregnancy.org °American College of Obstetricians and Gynecologists (Colegio Estadounidense de Obstetras y Ginecólogos): acog.org/womens-health/pregnancy? °Office on Women's Health (Oficina para la Salud de la Mujer): womenshealth.gov/pregnancy °Comuníquese con un médico si tiene: °Un dolor de cabeza que no desaparece después de tomar analgésicos. °Cambios en la visión o ve manchas delante de los ojos. °Cólicos leves, presión en la pelvis o dolor persistente en el abdomen. °Náuseas persistentes, vómitos o diarrea. °Secreción vaginal con mal olor u orina con olor fétido. °Dolor al orinar. °Hinchazón súbita o extrema del rostro, las manos, los tobillos, los pies o las piernas. °Fiebre. °Busque ayuda de inmediato si: °Tiene una pérdida de líquido por la vagina. °Tiene sangrado ligero o manchas vaginales. °Tiene dolor intenso o cólicos en el abdomen. °Presenta dificultad para respirar. °Siente dolor en el pecho. °Tiene episodios de desmayo. °No ha sentido a su bebé moverse durante el período de tiempo que le indicó el médico. °Tiene dolor, hinchazón o enrojecimiento nuevos o más intensos en un brazo o una pierna. °Resumen °El segundo trimestre de embarazo va desde la semana 13 hasta la 27 (  desde el mes 4 hasta el 6). °No use remedios a base de hierbas, alcohol, drogas ilegales ni medicamentos que no estén aprobados por el médico. Las sustancias químicas de estos productos pueden dañar al bebé. °Haga ejercicio solamente como se lo haya indicado el médico. La mayoría de las personas pueden continuar su rutina de ejercicios durante el  embarazo. °Cumpla con todas las visitas de seguimiento. Esto es importante. °Esta información no tiene como fin reemplazar el consejo del médico. Asegúrese de hacerle al médico cualquier pregunta que tenga. °Document Revised: 02/27/2020 Document Reviewed: 02/27/2020 °Elsevier Patient Education © 2022 Elsevier Inc. ° °

## 2021-03-01 ENCOUNTER — Telehealth: Payer: Self-pay | Admitting: Radiology

## 2021-03-01 NOTE — Progress Notes (Signed)
   PRENATAL VISIT NOTE  Subjective:  Sue Moore is a 35 y.o. G3P0020 at [redacted]w[redacted]d being seen today for ongoing prenatal care.  She is currently monitored for the following issues for this low-risk pregnancy and has Rh negative status during pregnancy; Marijuana use; Language barrier affecting health care; Supervision of high risk pregnancy, antepartum; Multigravida of advanced maternal age in first trimester; Anorexia; Bulimia; and Anxiety and depression on their problem list.  Patient reports nausea and vomiting.  Contractions: Not present. Vag. Bleeding: None.   . Denies leaking of fluid.   The following portions of the patient's history were reviewed and updated as appropriate: allergies, current medications, past family history, past medical history, past social history, past surgical history and problem list.   Objective:   Vitals:   02/28/21 1009  BP: (!) 89/55  Pulse: 64  Weight: 111 lb (50.3 kg)    Fetal Status: Fetal Heart Rate (bpm): 151         General:  Alert, oriented and cooperative. Patient is in no acute distress.  Skin: Skin is warm and dry. No rash noted.   Cardiovascular: Normal heart rate noted  Respiratory: Normal respiratory effort, no problems with respiration noted  Abdomen: Soft, gravid, appropriate for gestational age.  Pain/Pressure: Absent     Pelvic: Cervical exam deferred        Extremities: Normal range of motion.  Edema: None  Mental Status: Normal mood and affect. Normal behavior. Normal judgment and thought content.   Assessment and Plan:  Pregnancy: G3P0020 at [redacted]w[redacted]d 1. Language barrier affecting health care Spanish interpreter: Husband Feliz Beam used where needed, understands a good bit of English   2. Supervision of high risk pregnancy, antepartum Continue routine prenatal care.  - Prenatal Vit-Fe Fumarate-FA (MULTIVITAMIN-PRENATAL) 27-0.8 MG TABS tablet; Take 1 tablet by mouth daily at 12 noon.  Dispense: 90 tablet; Refill:  1  3. Multigravida of advanced maternal age in first trimester   4. Nausea and vomiting during pregnancy prior to [redacted] weeks gestation Trial of scope patch, sea bands, small, bland meals, frequently throughout the day - scopolamine (TRANSDERM-SCOP, 1.5 MG,) 1 MG/3DAYS; Place 1 patch (1.5 mg total) onto the skin every 3 (three) days.  Dispense: 10 patch; Refill: 12  General obstetric precautions including but not limited to vaginal bleeding, contractions, leaking of fluid and fetal movement were reviewed in detail with the patient. Please refer to After Visit Summary for other counseling recommendations.   Return in 4 weeks (on 03/28/2021).  Future Appointments  Date Time Provider Department Center  03/28/2021 10:30 AM [place] Bing, MD [facility]    Reva Bores, MD

## 2021-03-01 NOTE — Telephone Encounter (Signed)
Left message to call [facility] to inform patient of ultrasound appt at [facility] 02/23/21 @ 10:30

## 2021-03-25 ENCOUNTER — Ambulatory Visit: Payer: BC Managed Care – PPO

## 2021-03-25 ENCOUNTER — Other Ambulatory Visit: Payer: BC Managed Care – PPO

## 2021-03-28 ENCOUNTER — Encounter: Payer: Self-pay | Admitting: Obstetrics and Gynecology

## 2021-04-10 ENCOUNTER — Encounter: Payer: Self-pay | Admitting: Radiology

## 2021-04-11 ENCOUNTER — Ambulatory Visit (INDEPENDENT_AMBULATORY_CARE_PROVIDER_SITE_OTHER): Payer: Self-pay | Admitting: Obstetrics and Gynecology

## 2021-04-11 ENCOUNTER — Other Ambulatory Visit: Payer: Self-pay

## 2021-04-11 VITALS — BP 86/53 | HR 64 | Wt 116.6 lb

## 2021-04-11 DIAGNOSIS — O4440 Low lying placenta NOS or without hemorrhage, unspecified trimester: Secondary | ICD-10-CM | POA: Insufficient documentation

## 2021-04-11 DIAGNOSIS — O09521 Supervision of elderly multigravida, first trimester: Secondary | ICD-10-CM

## 2021-04-11 DIAGNOSIS — O0990 Supervision of high risk pregnancy, unspecified, unspecified trimester: Secondary | ICD-10-CM

## 2021-04-11 DIAGNOSIS — Z789 Other specified health status: Secondary | ICD-10-CM

## 2021-04-11 MED ORDER — PRENATAL 27-0.8 MG PO TABS: 1.0000 | ORAL_TABLET | Freq: Every day | ORAL | 1 refills | Status: AC

## 2021-04-11 NOTE — Progress Notes (Signed)
   PRENATAL VISIT NOTE  Subjective:  Sue Moore is a 35 y.o. G3P0020 at [redacted]w[redacted]d being seen today for ongoing prenatal care.  She is currently monitored for the following issues for this low-risk pregnancy and has Rh negative status during pregnancy; Marijuana use; Language barrier affecting health care; Supervision of high risk pregnancy, antepartum; Multigravida of advanced maternal age in first trimester; Anorexia; Bulimia; Anxiety and depression; and Low lying placenta, antepartum on their problem list.  Patient reports no complaints.  Contractions: Not present. Vag. Bleeding: None.  Movement: Present. Denies leaking of fluid.   The following portions of the patient's history were reviewed and updated as appropriate: allergies, current medications, past family history, past medical history, past social history, past surgical history and problem list.   Objective:   Vitals:   04/11/21 1505  BP: (!) 86/53  Pulse: 64  Weight: 116 lb 9.6 oz (52.9 kg)    Fetal Status: Fetal Heart Rate (bpm): 146 Fundal Height: 21 cm Movement: Present     General:  Alert, oriented and cooperative. Patient is in no acute distress.  Skin: Skin is warm and dry. No rash noted.   Cardiovascular: Normal heart rate noted  Respiratory: Normal respiratory effort, no problems with respiration noted  Abdomen: Soft, gravid, appropriate for gestational age.  Pain/Pressure: Present     Pelvic: Cervical exam deferred        Extremities: Normal range of motion.     Mental Status: Normal mood and affect. Normal behavior. Normal judgment and thought content.   Assessment and Plan:  Pregnancy: G3P0020 at [redacted]w[redacted]d 1. Multigravida of advanced maternal age in first trimester No issues. Low risk cffdna  2. Supervision of high risk pregnancy, antepartum - Prenatal Vit-Fe Fumarate-FA (MULTIVITAMIN-PRENATAL) 27-0.8 MG TABS tablet; Take 1 tablet by mouth daily at 12 noon.  Dispense: 90 tablet; Refill: 1  3. Low  lying placenta, antepartum 7/14 [facility] u/s: normal afi, 176gm, efw 15%, placenta 1.5cm from os, anterior. Normal anaotmy.   Precautions given. Rpt u/s in 1-2wks with [facility]  Preterm labor symptoms and general obstetric precautions including but not limited to vaginal bleeding, contractions, leaking of fluid and fetal movement were reviewed in detail with the patient. Please refer to After Visit Summary for other counseling recommendations.   Return in about 1 month (around 05/12/2021) for in person, low risk ob, md or app.  No future appointments.   Bing, MD

## 2021-04-11 NOTE — Progress Notes (Signed)
Refill prenatal

## 2021-05-16 ENCOUNTER — Other Ambulatory Visit: Payer: Self-pay

## 2021-05-16 ENCOUNTER — Ambulatory Visit (INDEPENDENT_AMBULATORY_CARE_PROVIDER_SITE_OTHER): Payer: Self-pay | Admitting: Obstetrics and Gynecology

## 2021-05-16 ENCOUNTER — Encounter: Payer: Self-pay | Admitting: Obstetrics and Gynecology

## 2021-05-16 VITALS — BP 91/57 | HR 74 | Wt 123.2 lb

## 2021-05-16 DIAGNOSIS — O4440 Low lying placenta NOS or without hemorrhage, unspecified trimester: Secondary | ICD-10-CM

## 2021-05-16 DIAGNOSIS — O26892 Other specified pregnancy related conditions, second trimester: Secondary | ICD-10-CM

## 2021-05-16 DIAGNOSIS — Z6791 Unspecified blood type, Rh negative: Secondary | ICD-10-CM

## 2021-05-16 DIAGNOSIS — O0990 Supervision of high risk pregnancy, unspecified, unspecified trimester: Secondary | ICD-10-CM

## 2021-05-16 DIAGNOSIS — Z3A26 26 weeks gestation of pregnancy: Secondary | ICD-10-CM

## 2021-05-16 DIAGNOSIS — O09521 Supervision of elderly multigravida, first trimester: Secondary | ICD-10-CM

## 2021-05-16 NOTE — Progress Notes (Signed)
    PRENATAL VISIT NOTE  Subjective:  Sue Moore is a 35 y.o. G3P0020 at [redacted]w[redacted]d being seen today for ongoing prenatal care.  She is currently monitored for the following issues for this low-risk pregnancy and has Rh negative status during pregnancy; Marijuana use; Language barrier affecting health care; Supervision of high risk pregnancy, antepartum; Multigravida of advanced maternal age in first trimester; Anorexia; Bulimia; Anxiety and depression; and Low lying placenta, antepartum on their problem list.  Patient reports no complaints.  Contractions: Irritability. Vag. Bleeding: None.  Movement: Present. Denies leaking of fluid.   The following portions of the patient's history were reviewed and updated as appropriate: allergies, current medications, past family history, past medical history, past social history, past surgical history and problem list.   Objective:   Vitals:   05/16/21 1117  BP: (!) 91/57  Pulse: 74  Weight: 123 lb 3.2 oz (55.9 kg)    Fetal Status: Fetal Heart Rate (bpm): 144 Fundal Height: 26 cm Movement: Present     General:  Alert, oriented and cooperative. Patient is in no acute distress.  Skin: Skin is warm and dry. No rash noted.   Cardiovascular: Normal heart rate noted  Respiratory: Normal respiratory effort, no problems with respiration noted  Abdomen: Soft, gravid, appropriate for gestational age.  Pain/Pressure: Present     Pelvic: Cervical exam deferred        Extremities: Normal range of motion.  Edema: Trace  Mental Status: Normal mood and affect. Normal behavior. Normal judgment and thought content.   Assessment and Plan:  Pregnancy: G3P0020 at [redacted]w[redacted]d 1. Supervision of high risk pregnancy, antepartum 28wk labs nv  2. Rh negative status during pregnancy in second trimester Antibody screen and rhogam  3. Multigravida of advanced maternal age in first trimester  4. Low lying placenta, antepartum Request for 8/22 [facility] u/s  sent again. ED precautions and pelvic rest until placenta has cleared. Was 1.5cm from anterior os on 7/14  5. [redacted] weeks gestation of pregnancy  Preterm labor symptoms and general obstetric precautions including but not limited to vaginal bleeding, contractions, leaking of fluid and fetal movement were reviewed in detail with the patient. Please refer to After Visit Summary for other counseling recommendations.   Return in about 2 weeks (around 05/30/2021) for low risk ob, in person, md or app, fasting 2hr GTT.  No future appointments.  [place] Bing, MD

## 2021-05-17 ENCOUNTER — Telehealth: Payer: Self-pay | Admitting: Obstetrics and Gynecology

## 2021-05-17 ENCOUNTER — Encounter: Payer: Self-pay | Admitting: *Deleted

## 2021-05-17 NOTE — Telephone Encounter (Signed)
OB Telephone [facility] u/s reviewed. Low lying placenta resolved and normal growth u/s  Cornelia Copa MD Attending Center for [employer] (Faculty Practice) 05/16/2021 Time: 1230pm

## 2021-06-03 ENCOUNTER — Encounter: Payer: Self-pay | Admitting: Family Medicine

## 2021-06-04 ENCOUNTER — Ambulatory Visit (INDEPENDENT_AMBULATORY_CARE_PROVIDER_SITE_OTHER): Payer: Self-pay | Admitting: Obstetrics & Gynecology

## 2021-06-04 ENCOUNTER — Other Ambulatory Visit: Payer: Self-pay

## 2021-06-04 ENCOUNTER — Encounter: Payer: Self-pay | Admitting: Obstetrics & Gynecology

## 2021-06-04 VITALS — BP 92/59 | HR 71 | Wt 126.2 lb

## 2021-06-04 DIAGNOSIS — O360930 Maternal care for other rhesus isoimmunization, third trimester, not applicable or unspecified: Secondary | ICD-10-CM

## 2021-06-04 DIAGNOSIS — Z3A29 29 weeks gestation of pregnancy: Secondary | ICD-10-CM

## 2021-06-04 DIAGNOSIS — O0993 Supervision of high risk pregnancy, unspecified, third trimester: Secondary | ICD-10-CM

## 2021-06-04 DIAGNOSIS — O0990 Supervision of high risk pregnancy, unspecified, unspecified trimester: Secondary | ICD-10-CM

## 2021-06-04 DIAGNOSIS — Z6791 Unspecified blood type, Rh negative: Secondary | ICD-10-CM

## 2021-06-04 MED ORDER — RHO D IMMUNE GLOBULIN 1500 UNIT/2ML IJ SOSY
300.0000 ug | PREFILLED_SYRINGE | Freq: Once | INTRAMUSCULAR | Status: AC
  Administered 2021-06-04: 300 ug via INTRAMUSCULAR

## 2021-06-04 NOTE — Progress Notes (Signed)
   PRENATAL VISIT NOTE  Subjective:  Sue Moore is a 35 y.o. G3P0020 at [redacted]w[redacted]d being seen today for ongoing prenatal care. Patient is Spanish-speaking only, declined interpreter and signed the refusal of interpreter form that is scanned into her chart.  Her husband who accompanied her will interpret for her. She is currently monitored for the following issues for this low-risk pregnancy and has Rh negative status during pregnancy; Marijuana use; Language barrier affecting health care; Supervision of high risk pregnancy, antepartum; Multigravida of advanced maternal age in third trimester; Anorexia; Bulimia; and Anxiety and depression on their problem list.  Patient reports no complaints.  Contractions: Irritability. Vag. Bleeding: None.  Movement: Present. Denies leaking of fluid.   The following portions of the patient's history were reviewed and updated as appropriate: allergies, current medications, past family history, past medical history, past social history, past surgical history and problem list.   Objective:   Vitals:   06/04/21 0842  BP: (!) 92/59  Pulse: 71  Weight: 126 lb 3.2 oz (57.2 kg)    Fetal Status: Fetal Heart Rate (bpm): 145 Fundal Height: 29 cm Movement: Present     General:  Alert, oriented and cooperative. Patient is in no acute distress.  Skin: Skin is warm and dry. No rash noted.   Cardiovascular: Normal heart rate noted  Respiratory: Normal respiratory effort, no problems with respiration noted  Abdomen: Soft, gravid, appropriate for gestational age.  Pain/Pressure: Present     Pelvic: Cervical exam deferred        Extremities: Normal range of motion.  Edema: Trace  Mental Status: Normal mood and affect. Normal behavior. Normal judgment and thought content.   Assessment and Plan:  Pregnancy: G3P0020 at [redacted]w[redacted]d 1. Rh negative status during pregnancy in third trimester Rhogam given today - Antibody screen - rho (d) immune globulin  (RHIG/RHOPHYLAC) injection 300 mcg  2. [redacted] weeks gestation of pregnancy 3. Supervision of high risk pregnancy, antepartum Third trimester labs today. Gave letter to get Tdap and Flu vaccines at [facility]. - Glucose Tolerance, 2 Hours w/1 Hour - CBC - RPR - HIV Antibody (routine testing w rflx) - Antibody screen  Preterm labor symptoms and general obstetric precautions including but not limited to vaginal bleeding, contractions, leaking of fluid and fetal movement were reviewed in detail with the patient. Please refer to After Visit Summary for other counseling recommendations.   Return in 2 weeks (on 06/18/2021) for OFFICE OB VISIT (MD or APP).  Future Appointments  Date Time Provider Department Center  06/18/2021  4:20 PM [place], Jethro Bastos, MD [facility]    Jaynie Collins, MD

## 2021-06-04 NOTE — Patient Instructions (Addendum)
AREA PEDIATRIC/FAMILY PRACTICE PHYSICIANS  Central/Southeast [place] ([address]) [facility] Chambliss, MD; Eniola, MD; Hale, MD; Hensel, MD; McDiarmid, MD; McIntyer, MD; Neal, MD; Walden, MD [address] [place], [place] [address] [phone] Mon-Fri 8:30-12:30, 1:30-5:00 Providers come to see babies at [facility] Accepting Medicaid [facility] providers who accept newborns: Koirala, MD; Morrow, MD; Wolters, MD [address], [place], [place] [address] [phone] Mon-Fri 8:00-5:30 Babies seen by providers at [facility] Does NOT accept Medicaid Please call early in hospitalization for appointment (limited availability)  [facility] Mulberry, MD [address] [place], [place] [address] [phone] Mon, Tue, Thur, Fri 8:30-5:00, Wed 10:00-7:00 (closed 1-2pm) Babies seen by [facility] providers Accepting Medicaid Rubin - Pediatrician Rubin, MD [address], [place], [place] [address] [phone] Mon-Fri 8:30-5:00, Sat 8:30-12:00 Provider comes to see babies at [facility] Accepting Medicaid Must have been referred from current patients or contacted office prior to delivery [facility] ([facility]) Brown, MD; Chandler, MD; Ettefagh, MD; Grant, MD; Lester, MD; McCormick, MD; McQueen, MD; Prose, MD; Simha, MD; Stanley, MD; Stryffeler, NP; Tebben, NP [address], [place], [place] [address] [phone] Mon, Tue, Thur, Fri 8:30-5:30, Wed 9:30-5:30, Sat 8:30-12:30 Babies seen by [facility] providers Accepting Medicaid Only accepting infants of first-time parents or siblings of current patients Hospital discharge coordinator will make follow-up appointment Jack Amos [address], [place], [place]  [address] [phone]   Fax - [phone] [facility] [address], [place], [place]  [address] Phone - [phone]   Fax - [phone] Shilpa Gosrani [address], [place], [place]  [address] [phone]  East/Northeast [place] ([address]) [facility] Bates, MD; Brassfield, MD; Cooper, Cox, MD; MD; Davis, MD; Dovico, MD; Ettefaugh, MD; Little, MD; Lowe, MD; Keiffer, MD; Melvin, MD; Sumner, MD; Williams, MD [address], [place], [place] [address] [phone] Mon-Fri 8:30-5:00 (extended evenings Mon-Thur as needed), Sat-Sun 10:00-1:00 Providers come to see babies at [facility] Accepting Medicaid for families of first-time babies and families with all children in the household age 3 and under. Must register with office prior to making appointment (M-F only). [facility] Henson, NP; Knapp, MD; Lalonde, MD; Tysinger, PA [address] [place], [place] [address] [phone] Mon-Fri 8:00-5:00 Babies seen by providers at [facility] Does NOT accept Medicaid/Commercial Insurance Only [facility] ([facility])  Artis, MD; Barnes, MD; Bratton, MD; Coccaro, MD; Lockett Gardner, MD; Kramer, MD; Marshall, MD; Netherton, MD; Poleto, MD; Skinner, MD [address] [place], [place] [address] [phone] Mon-Fri 8:30-5:30, Sat (Oct.-Mar.) 9:00-1:00 Babies seen by providers at [facility] Accepting Medicaid  [place] ([address]) [facility] Reid, MD; Warner, MD [address], [place], [place] [address] [phone] Mon-Fri 8:30-5:00, Sat 8:30-12:00 Providers come to see babies at [facility] Does NOT accept Medicaid [facility] Becker, PA; Hagler, MD; Scifres, PA; Sun, MD; Swayne, MD [address], [place], [place] [address] [phone] Mon-Fri 8:00-5:00 Babies seen by providers at [facility] Does NOT accept Medicaid Only accepting babies of parents who  are patients Please call early in hospitalization for appointment (limited availability) [facility] Clark, MD; Frye, MD; Kelleher, MD; Mack, NP; Miller, MD; O'Keller, MD; Patterson, NP; Pudlo, MD; Puzio, MD; Thomas, MD; Tucker, MD; Twiselton, MD [address], [place], [place] [address] [phone] Mon-Fri 8:00-5:00, Sat 9:00-12:00 Providers come to see babies at [facility] Does NOT accept Medicaid  [place] ([address]) [facility] providers accepting new patients: Brake, NP; Wharton, PA [address], [place], [place] [address] [phone] Mon-Fri 8:00-5:00 Babies seen by providers at [facility] Does NOT accept Medicaid Only accepting babies of parents who are patients Please call early in hospitalization for appointment (limited availability) [facility] Gay, MD; Quinlan, MD [address] [place], [place] [address] [phone] (press 1 to schedule appointment) Mon-Fri 8:00-5:00 Providers come to see babies at [facility] Does NOT accept Medicaid [facility] Mazer, MD [address] [place], [place] [address] [phone] Mon-Fri 8:30-5:00 (lunch 12:30-1:00), extended hours by appointment only Wed 5:00-6:30 Babies seen by [facility] providers Accepting Medicaid [facility] Banks, MD; Jordan, MD; Koberlein, MD [address], [place], [place] [address] [phone] Mon-Fri 8:00-5:00 Babies seen by [facility] providers Does NOT accept Medicaid [facility] Parker, MD; Hunter, MD; Wallace, DO [address] [place], [place] [address] [phone] Mon-Fri 8:00-5:00 Babies seen by [facility] providers Does NOT accept Medicaid [facility] Brandon, PA; Brecken, PA; Christy, NP; Dees, MD; DeClaire, MD; DeWeese, MD; Hansen, NP; Mills, NP; Parrish, NP; Smoot, NP; Summer, MD; Vapne,  MD [address] [place], [place] [address] [phone] Mon-Fri 8:30-5:00, Sat 10:00-1:00 Providers come to see babies at [facility] Does NOT accept Medicaid Free prenatal information session Tuesdays at 4:45pm [facility] Bouska, MD; Gordon, PA; Jeffery, PA; Weber, PA [address] [place] [address] [phone] Mon-Fri 7:30-5:30 Babies seen by [facility] providers [facility] [address], [place], [place]  [address] [phone]   Fax - [phone]  [place] ([address] & [address]) [facility] [place] [address] [place], [place] [address] [phone] Mon-Thur 8:00-6:00 Providers come to see babies at [facility] Accepting Medicaid [facility] Anderson, NP; Badger, MD; Beal, PA; Spencer, PA [address] [place], [place] [address] [phone] Mon-Thur 7:30-7:30, Fri 7:30-4:30 Babies seen by [facility] providers Accepting Medicaid [facility] Agbuya, MD; Klett, NP; Romgoolam, MD [address], [place], [place] [address] [phone] Mon-Fri 8:30-5:00, Sat 8:30-12:00 Providers come to see babies at [facility] Accepting Medicaid Must have "Meet & Greet" appointment at office prior to delivery [facility] ([facility]) McCord, MD; Wallace, MD; Wood, MD [address], [place], [place] [address] [phone] Mon-Wed 8:00-6:00, Thur-Fri 8:00-5:00, Sat 9:00-12:00 Providers come to see babies at [facility] Does NOT accept Medicaid Only accepting siblings of current patients [facility]  [address], [place], [place]  [address] [phone]   Fax - [phone] [facility] [address], [place], [place]  [address] [phone]   Fax -  [phone]  [place] ([address] & [address]) [facility] Cirigliano, DO; Matthews, DO [address] [place], [place] [address] [phone] Mon-Fri 7:00-5:00 Babies seen by [facility] providers Does NOT accept Medicaid [facility] Briscoe, MD; Howley, PA; Moreira, PA [address], [place],  [address] [phone] Mon-Fri 8:00-5:00 Babies seen by [facility] providers Accepting Medicaid [facility] Boyd, MD; Church, PA; Jones, NP; Osborn, PA [address], ,  [address] [phone] Mon-Fri 8:00-5:00 Babies seen by providers at [facility] Accepting Medicaid  [place] ([address]) [facility] [place] Wendling, DO [address] [place], [place] [address] [phone] Mon-Fri 8:00-5:00 Babies seen by [facility] providers Does NOT accept Medicaid Limited availability, please call early in hospitalization to schedule follow-up [facility] Calderon, PA; Cummings, MD; Dillard, MD; Martin, PA; Olson, MD; VanDeven, PA [address], [place], [place] [address] [phone] Mon-Fri 8:30-5:00, Sat 9:00-12:00 Babies seen by providers at [facility] Accepting Medicaid Please register online then schedule online or call office [email or web address]) Hunter, NP; Kumar, MD; Martin Rogers, PA [address], [place], [place] [address] [phone] Mon-Fri 8:00-5:00 Babies seen by providers at [facility] Accepting Medicaid [facility] ([facility]) Glasgow, MD; Kristi Fleenor, NP; West, MD [address], [place], [place] [address] [phone] Mon-Fri 8:00-5:30, Sat&Sun by appointment (phones open at  8:30) Babies seen by [facility] providers Accepting Medicaid Must be a first-time baby or sibling of current patient [facility] [place]  [address], [place], [place]  [address] [phone]   Fax - [phone]  [place] ([address]) [facility] Brown, PA; Cowen, PA; Rice, MD; Helton, PA; Spry, MD [address] [place], [place] [address] [phone] Mon-Thur 8:00-7:00, Fri 8:00-5:00, Sat 8:00-12:00, Sun 9:00-12:00 Babies seen by [facility] providers Accepting Medicaid [facility] Coe-Goins, MD; Marshall, MD; Pierre-Louis, MD [address], [place], [place] [address] [phone] Mon-Thur 8:00-5:00 Babies seen by providers at [facility] Accepting Medicaid [facility] Bratton, MD; Coe-Goins, MD; Hayes, MD; Lewis, MD; List, MD; Lott, MD; Marshall, MD; Moran, MD; O'Neal, MD; Pierre-Louis, MD; Pitonzo, MD; Scholer, MD; Spangle, MD [address] [place], [place] [address] [phone] Mon-Fri 8:00-5:30, Sat (Oct.-Mar.) 9:00-1:00 Babies seen by providers at [facility] Accepting Medicaid Must fill out new patient packet, available online at [email or web address] [facility] ([facility]) Friddle, NP; Harris, NP; Kelly, NP; Logan, MD; Melvin, PA; Poth, MD; Ramadoss, MD; Stanton, NP [address], [place], [place] [address] [phone] Mon-Thur 8:00-5:30, Fri 8:00-5:00 Babies seen by providers at [facility] Accepting Medicaid  [place] ([address]) [facility] Dixon, PA; Kalaheo, MD; Pickard, MD; Tapia, PA [address], [place], [place] [address] [phone] Mon-Fri 8:00-5:00 Babies seen by providers at [facility] Accepting Medicaid   [place] ([address]) [facility] [place] Masneri, DO; Meyers, MD; Nelson, PA [address], [place], [place] [address] [phone] Mon-Fri 8:00-5:00 Babies seen by providers at [facility] Does NOT accept Medicaid Limited appointment availability, please call early in hospitalization   [facility] [place] Kunedd, DO; McGowen, MD [address], [place], [place] [address] [phone] Mon-Fri 8:00-5:00 Babies seen by [facility] providers Does NOT accept Medicaid [facility] [place] Cameron, MD; MacDonald, MD; Michaels, PA; Nayak, MD [address] [place] [address], [place], [place] [address] [phone] Mon-Fri 8:00-5:00 After hours clinic ([address] [place], [place] [address]) [phone] Mon-Fri 5:00-8:00, Sat 12:00-6:00, Sun 10:00-4:00 Babies seen by [facility] providers Accepting Medicaid [facility] [place] [address], [place], [place]  [address] [phone]   Fax - [phone]  [place] [phone]) Adult nurse [facility], MD [address] [place], [place], [place] [address] [phone] Mon-Fri 8:00-5:00 Babies seen by [facility] providers Does NOT accept Medicaid [facility] [place] [facility] [place]) Rene Kocher, MD [address] [place], [place] [address] [phone] Mon-Thur 8:00-7:00, Fri 8:00-5:00, Sat 8:00-12:00 Babies seen by providers at [facility] Accepting Medicaid - but does not have vaccinations in office (must be received elsewhere) Limited availability, please call early in hospitalization  [place] [phone]) [facility]  Wyvonne Lenz, MD [address] [place] [address] [phone]  Fax [phone]  [facility]  Lyndel Safe, MD, [place], [place], [place], [place] [address] [place], [place]  [address] [phone] [facility]  [address] [place] [address] Sherian Maroon [place], [place] [address] [phone] [address] [place], [place] [address] [phone] [facility] Office)  [facility] [address], [place], [place] [address] [phone] [facility] [address] [place], [place], [place] [address] [phone] [facility] [address], [place], [place] [address] [phone] [facility] [address] [place], [place] [address] [phone] [facility] [address] [place], [place] [address] [phone] [facility] [address] [place], [place] [address] [phone] [facility]  [address] [place], [place] [address] [phone] Dr. Belia Heman. [place] [address] [place], [place] [address] [phone] [facility] [address], [place], [place] [address] [phone] [facility] [address], [place], [place] [address] [phone]  Prevencin del parto prematuro Preventing Preterm Birth Se conoce como parto prematuro al nacimiento del beb entre las semanas 20 y 37 del Psychiatrist. Un embarazo a trmino comprende un mnimo de 37 semanas. El parto prematuro puede aumentar el riesgo de complicaciones para el beb porque no ha madurado completamente antes de Psychologist, clinical. Cmo puede afectar al beb el parto prematuro? Las complicaciones del parto prematuro pueden incluir las siguientes: Problemas respiratorios. Problemas de visin o audicin. Dificultad para alimentarse. Infecciones o inflamacin del tubo digestivo (colitis). [employer] al nacer o muy bajo peso al nacer. Dao cerebral que causa retrasos en el desarrollo y discapacidades de aprendizaje, y que afecta al movimiento y la coordinacin (parlisis cerebral). Mayor riesgo de padecer diabetes, enfermedades cardacas y presin arterial alta en el futuro. Qu puede aumentar mi riesgo de  tener un parto prematuro? Antecedentes mdicos No se conoce la causa exacta de los partos prematuros. Los siguientes factores pueden hacerla ms propensa a tener un parto prematuro: Recibir un diagnstico de placenta previa. Esta es una afeccin en la que la placenta cubre la parte inferior del tero (cuello uterino), que se abre hacia la vagina. Ciertas afecciones del Psychiatrist actual y de Proofreader, como: Haber tenido un parto prematuro antes. Estar embarazada de ms de un beb. Tener embarazos seguidos con menos de 18 meses de diferencia. Ciertas anormalidades en el beb en gestacin. Hemorragia vaginal durante el embarazo. Quedar embarazada a travs de fertilizacin in vitro (FIV). Tener sobrepeso o [place]. Antecedentes mdicos de lo siguiente: ITS (infecciones de transmisin sexual) u otras infecciones en las vas urinarias y la vagina. Enfermedades a Air cabin crew (crnicas), como problemas de coagulacin sangunea, diabetes o hipertensin arterial. Cuello uterino corto. Factores relacionados con el ambiente y el estilo de vida Consumir drogas o productos que contengan tabaco. Consumo de alcohol.  Tener estrs y no contar con apoyo social. Violencia en el hogar (violencia domstica). Estar expuesta a ciertas sustancias qumicas o contaminantes del ambiente. Qu medidas puedo tomar para evitar un parto prematuro? Atencin mdica Lo ms importante que puede hacer para disminuir el riesgo de Warehouse manager un parto prematuro es Barista atencin mdica de rutina durante el Psychiatrist (cuidado prenatal). Concurra a todas las visitas de seguimiento. Esto es importante. Si corre un riesgo alto de Warehouse manager un parto prematuro: La podran derivar a un mdico que se especialice en el control de embarazos de Conservator, museum/gallery (perinatlogo). Tal vez le receten medicamentos para ayudar a Ship broker. Estilo de vida Ciertos cambios en el estilo de vida tambin pueden disminuir el riesgo  de tener un parto prematuro: Espere al menos 6 meses despus de un embarazo para volver a Scientist, research (physical sciences). Antes de Kerry Kass, logre un peso saludable. Si tiene sobrepeso, trabaje con el mdico para adelgazar sin riegos. No consuma ningn producto que contenga nicotina o tabaco. Estos productos incluyen cigarrillos, tabaco para Theatre manager y aparatos de vapeo, como los Administrator, Civil Service. Si necesita ayuda para dejar de fumar, consulte al mdico. No beba alcohol. No consuma drogas. Siga una dieta saludable. Controle otros problemas mdicos que tenga, como por ejemplo, diabetes o presin arterial alta. Dnde buscar apoyo Para obtener ms ayuda, considere lo siguiente: Hablar con el mdico. Hablar con un terapeuta o un asesor de consumo de drogas si necesita ayuda para dejar de hacerlo. Trabajar con un nutricionista o un entrenador fsico para Technical brewer peso saludable. Unirse a un grupo de apoyo. Dnde buscar ms informacin Obtenga ms informacin sobre cmo prevenir un Advice worker en los siguientes sitios: Marine scientist for Disease Control and Prevention (Centros para el Control y la Prevencin de [place]): TonerPromos.no March of Dimes: marchofdimes.org American Pregnancy Association (Asociacin Estadounidense del Embarazo): americanpregnancy.org Comunquese con un mdico si: Tiene cualquiera de estos sntomas de trabajo de parto prematuro antes de las 37 semanas: Cambio o aumento de la secrecin vaginal. Prdida de lquido por la vagina. Presin o calambres en la parte inferior del abdomen. Dolor de espalda que no se calma o empeora. Endurecimiento regular (contracciones) en la parte inferior del abdomen. Solicite ayuda de inmediato si: Tiene contracciones dolorosas y regulares cada 5 minutos o menos. Rompe la bolsa. Resumen Parto prematuro significa tener al beb [employer] 20 a 37 del embarazo. El parto prematuro puede poner al beb en riesgo de complicaciones  de [place]. No se conoce la causa exacta de los partos prematuros. Obtener un buen cuidado prenatal de rutina puede ayudar a Ship broker. Concurra a todas las visitas de seguimiento. Esto es importante. Comunquese con un mdico si tiene sntomas de trabajo de [employer]. Esta informacin no tiene Theme park manager el consejo del mdico. Asegrese de hacerle al mdico cualquier pregunta que tenga. Document Revised: 09/07/2020 Document Reviewed: 09/07/2020 Elsevier Patient Education  2022 [employer].

## 2021-06-05 LAB — CBC
Hematocrit: 32.2 % — ABNORMAL LOW (ref 34.0–46.6)
Hemoglobin: 10.8 g/dL — ABNORMAL LOW (ref 11.1–15.9)
MCH: 30.5 pg (ref 26.6–33.0)
MCHC: 33.5 g/dL (ref 31.5–35.7)
MCV: 91 fL (ref 79–97)
Platelets: 261 10*3/uL (ref 150–450)
RBC: 3.54 x10E6/uL — ABNORMAL LOW (ref 3.77–5.28)
RDW: 11.9 % (ref 11.7–15.4)
WBC: 8.7 10*3/uL (ref 3.4–10.8)

## 2021-06-05 LAB — GLUCOSE TOLERANCE, 2 HOURS W/ 1HR
Glucose, 1 hour: 136 mg/dL (ref 65–179)
Glucose, 2 hour: 116 mg/dL (ref 65–152)
Glucose, Fasting: 84 mg/dL (ref 65–91)

## 2021-06-05 LAB — HIV ANTIBODY (ROUTINE TESTING W REFLEX): HIV Screen 4th Generation wRfx: NONREACTIVE

## 2021-06-05 LAB — RPR: RPR Ser Ql: NONREACTIVE

## 2021-06-05 LAB — ANTIBODY SCREEN: Antibody Screen: NEGATIVE

## 2021-06-18 ENCOUNTER — Ambulatory Visit (INDEPENDENT_AMBULATORY_CARE_PROVIDER_SITE_OTHER): Payer: Self-pay | Admitting: Obstetrics & Gynecology

## 2021-06-18 ENCOUNTER — Encounter: Payer: Self-pay | Admitting: Obstetrics & Gynecology

## 2021-06-18 ENCOUNTER — Other Ambulatory Visit: Payer: Self-pay

## 2021-06-18 VITALS — BP 96/61 | HR 64 | Wt 127.0 lb

## 2021-06-18 DIAGNOSIS — M549 Dorsalgia, unspecified: Secondary | ICD-10-CM

## 2021-06-18 DIAGNOSIS — Z3A31 31 weeks gestation of pregnancy: Secondary | ICD-10-CM

## 2021-06-18 DIAGNOSIS — O99891 Other specified diseases and conditions complicating pregnancy: Secondary | ICD-10-CM

## 2021-06-18 DIAGNOSIS — O09523 Supervision of elderly multigravida, third trimester: Secondary | ICD-10-CM

## 2021-06-18 DIAGNOSIS — O0990 Supervision of high risk pregnancy, unspecified, unspecified trimester: Secondary | ICD-10-CM

## 2021-06-18 MED ORDER — CYCLOBENZAPRINE HCL 10 MG PO TABS: 5.0000 mg | ORAL_TABLET | Freq: Three times a day (TID) | ORAL | 2 refills | Status: DC | PRN

## 2021-06-18 NOTE — Progress Notes (Signed)
   PRENATAL VISIT NOTE  Subjective:  Sue Moore is a 35 y.o. G3P0020 at [redacted]w[redacted]d being seen today for ongoing prenatal care.  She is currently monitored for the following issues for this high-risk pregnancy and has Rh negative status during pregnancy; Marijuana use; Language barrier affecting health care; Supervision of high risk pregnancy, antepartum; Multigravida of advanced maternal age in third trimester; Anorexia; Bulimia; and Anxiety and depression on their problem list.  Patient reports back pain, swelling in ankles and having a hard time sleeping.  Contractions: Irritability. Vag. Bleeding: None.  Movement: Present. Denies leaking of fluid.   The following portions of the patient's history were reviewed and updated as appropriate: allergies, current medications, past family history, past medical history, past social history, past surgical history and problem list.   Objective:   Vitals:   06/18/21 1621  BP: 96/61  Pulse: 64  Weight: 127 lb (57.6 kg)    Fetal Status: Fetal Heart Rate (bpm): 145 Fundal Height: 31 cm Movement: Present     General:  Alert, oriented and cooperative. Patient is in no acute distress.  Skin: Skin is warm and dry. No rash noted.   Cardiovascular: Normal heart rate noted  Respiratory: Normal respiratory effort, no problems with respiration noted  Abdomen: Soft, gravid, appropriate for gestational age.  Pain/Pressure: Present     Pelvic: Cervical exam deferred        Extremities: Normal range of motion.  Edema: Trace  Mental Status: Normal mood and affect. Normal behavior. Normal judgment and thought content.   Assessment and Plan:  Pregnancy: G3P0020 at [redacted]w[redacted]d 1. Back pain affecting pregnancy in third trimester Tylenol recommended. Also recommended support belt. Flexeril prescribed as needed. - cyclobenzaprine (FLEXERIL) 10 MG tablet; Take 0.5-1 tablets (5-10 mg total) by mouth 3 (three) times daily as needed for muscle spasms.   Dispense: 30 tablet; Refill: 2  2. [redacted] weeks gestation of pregnancy 3. Multigravida of advanced maternal age in third trimester 4. Supervision of high risk pregnancy, antepartum Normal third trimester labs reviewed with patient. Reassured patient about symptoms, recommended Benadryl for insomnia (she reported she does not like this), elevation of legs for ankle edema. Preterm labor symptoms and general obstetric precautions including but not limited to vaginal bleeding, contractions, leaking of fluid and fetal movement were reviewed in detail with the patient. Please refer to After Visit Summary for other counseling recommendations.   Return in about 2 weeks (around 07/02/2021) for OFFICE OB VISIT (MD or APP).  Future Appointments  Date Time Provider Department Center  07/04/2021  3:50 PM [place] Delahoussaye, Jethro Bastos, MD [facility]    Jaynie Collins, MD

## 2021-06-18 NOTE — Patient Instructions (Signed)
Return to office for any scheduled appointments. Call the office or go to the [facility] at [facility] if:  You begin to have strong, frequent contractions  Your water breaks.  Sometimes it is a big gush of fluid, sometimes it is just a trickle that keeps getting your panties wet or running down your legs  You have vaginal bleeding.  It is normal to have a small amount of spotting if your cervix was checked.   You do not feel your baby moving like normal.  If you do not, get something to eat and drink and lay down and focus on feeling your baby move.   If your baby is still not moving like normal, you should call the office or go to MAU.  Any other obstetric concerns.   

## 2021-07-01 ENCOUNTER — Ambulatory Visit (INDEPENDENT_AMBULATORY_CARE_PROVIDER_SITE_OTHER): Payer: Self-pay | Admitting: Family Medicine

## 2021-07-01 ENCOUNTER — Other Ambulatory Visit: Payer: Self-pay

## 2021-07-01 VITALS — BP 97/61 | HR 58 | Temp 97.8°F | Wt 129.0 lb

## 2021-07-01 DIAGNOSIS — O09523 Supervision of elderly multigravida, third trimester: Secondary | ICD-10-CM

## 2021-07-01 DIAGNOSIS — R3915 Urgency of urination: Secondary | ICD-10-CM

## 2021-07-01 DIAGNOSIS — O0990 Supervision of high risk pregnancy, unspecified, unspecified trimester: Secondary | ICD-10-CM

## 2021-07-01 DIAGNOSIS — K219 Gastro-esophageal reflux disease without esophagitis: Secondary | ICD-10-CM

## 2021-07-01 LAB — POCT URINALYSIS DIPSTICK
Bilirubin, UA: NEGATIVE
Blood, UA: NEGATIVE
Glucose, UA: NEGATIVE
Leukocytes, UA: NEGATIVE
Nitrite, UA: NEGATIVE
Protein, UA: NEGATIVE
Spec Grav, UA: 1.01 (ref 1.010–1.025)
Urobilinogen, UA: 0.2 E.U./dL
pH, UA: 6 (ref 5.0–8.0)

## 2021-07-01 MED ORDER — PANTOPRAZOLE SODIUM 20 MG PO TBEC: 20.0000 mg | DELAYED_RELEASE_TABLET | Freq: Every day | ORAL | 1 refills | Status: DC

## 2021-07-01 NOTE — Progress Notes (Signed)
   PRENATAL VISIT NOTE  Subjective:  Sue Moore is a 35 y.o. G3P0020 at [redacted]w[redacted]d being seen today for ongoing prenatal care.  She is currently monitored for the following issues for this low-risk pregnancy and has Rh negative status during pregnancy; Marijuana use; Language barrier affecting health care; Supervision of high risk pregnancy, antepartum; Multigravida of advanced maternal age in third trimester; Anorexia; Bulimia; and Anxiety and depression on their problem list.  Patient reports contractions since last night and difficulty with urination. Reports urgency and feels like UTI. Denies fever/chills/hematuria. Notes no vaginal bleeding. Has significant N/V/GERD.  Contractions: Irritability. Vag. Bleeding: None.  Movement: Present. Denies leaking of fluid.   The following portions of the patient's history were reviewed and updated as appropriate: allergies, current medications, past family history, past medical history, past social history, past surgical history and problem list.   Objective:   Vitals:   07/01/21 1339  BP: 97/61  Pulse: (!) 58  Temp: 97.8 F (36.6 C)  Weight: 129 lb (58.5 kg)    Fetal Status: Fetal Heart Rate (bpm): 152 Fundal Height: 32 cm Movement: Present  Presentation: Vertex  General:  Alert, oriented and cooperative. Patient is in no acute distress.  Skin: Skin is warm and dry. No rash noted.   Cardiovascular: Normal heart rate noted  Respiratory: Normal respiratory effort, no problems with respiration noted  Abdomen: Soft, gravid, appropriate for gestational age.  Pain/Pressure: Absent     Pelvic: Cervical exam performed in the presence of a chaperone Dilation: Closed Effacement (%): 80 Station: -1, 0  Extremities: Normal range of motion.  Edema: Trace  Mental Status: Normal mood and affect. Normal behavior. Normal judgment and thought content.   Component Ref Range & Units 14:24may take 2x/wk  Color, UA  pale yellow   Clarity, UA  clear    Glucose, UA Negative Negative   Bilirubin, UA  neg   Ketones, UA  small   Spec Grav, UA 1.010 - 1.025 1.010   Blood, UA  Negative   pH, UA 5.0 - 8.0 6.0   Protein, UA Negative Negative   Urobilinogen, UA 0.2 or 1.0 E.U./dL 0.2   Nitrite, UA  neg   Leukocytes, UA Negative Negative   Appearance    Odor  None    Assessment and Plan:  Pregnancy: G3P0020 at [redacted]w[redacted]d 1. Supervision of high risk pregnancy, antepartum Continue routine prenatal care. No signs of PTL.  2. Multigravida of advanced maternal age in third trimester Low risk female  3. Urinary urgency Check culture but u/a not c/w this - POCT Urinalysis Dipstick - Culture, OB Urine  4. Gastroesophageal reflux disease without esophagitis May take 2x,wk - pantoprazole (PROTONIX) 20 MG tablet; Take 1 tablet (20 mg total) by mouth daily.  Dispense: 30 tablet; Refill: 1  Preterm labor symptoms and general obstetric precautions including but not limited to vaginal bleeding, contractions, leaking of fluid and fetal movement were reviewed in detail with the patient. Please refer to After Visit Summary for other counseling recommendations.   Return in 2 weeks (on 07/15/2021).  Future Appointments  Date Time Provider Department Center  07/17/2021  3:10 PM Reva Bores, MD [facility]    Reva Bores, MD

## 2021-07-01 NOTE — Progress Notes (Signed)
ROB [redacted]w[redacted]d walk in today for problem visit.   CC: not able to urinate and having urge to use bathroom  notes discomfort. Increase in vomiting this morning. Pt has not been able to eat today.  Also complains of Reflux taking tums.

## 2021-07-01 NOTE — Patient Instructions (Signed)
Tercer trimestre de embarazo Third Trimester of Pregnancy El tercer trimestre de embarazo va desde la semana 28 hasta la semana 40. Esto corresponde a los meses 7 a 9. El tercer trimestre es un perodo en el que el beb en gestacin (feto) crece rpidamente. Hacia el final del noveno mes, el feto mide alrededor de 20pulgadas (45cm) de largo y pesa entre 6 y 10 libras (2.7 y 4.5kg). Cambios en el cuerpo durante el tercer trimestre Durante el tercer trimestre, su cuerpo contina experimentando numerosos cambios. Los cambios varan y generalmente vuelven a la normalidad despus del nacimiento del beb. Cambios fsicos Seguir aumentando de peso. Es de esperar que aumente entre 25 y 35libras (11 y 16kg) hacia el final del embarazo si inicia el embarazo con un peso normal. Si tiene bajo peso, es de esperar que aumente entre 28 y 40 libras (13 y18 kg), y si tiene sobrepeso, es de esperar que aumente entre 15 y 25 libras (7 y 11kg). Podrn aparecer las primeras estras en las caderas, el abdomen y las mamas. Las mamas seguirn creciendo y pueden doler. Un lquido amarillo (calostro) puede salir de sus pechos. Esta es la primera leche que usted produce para su beb. Tal vez haya cambios en el cabello. Esto cambios pueden incluir su engrosamiento, crecimiento rpido y cambios en la textura. A algunas personas tambin se les cae el cabello durante o despus del embarazo, o tienen el cabello seco o fino. El ombligo puede salir hacia afuera. Puede observar que se le hinchan las manos, el rostro o los tobillos. Cambios en la salud Es posible que tenga acidez estomacal. Puede sufrir estreimiento. Puede desarrollar hemorroides. Puede desarrollar venas hinchadas y abultadas (venas varicosas) en las piernas. Puede presentar ms dolor en la pelvis, la espalda o los muslos. Esto se debe al aumento de peso y al aumento de las hormonas que relajan las articulaciones. Puede presentar un aumento del hormigueo o  entumecimiento en las manos, brazos y piernas. La piel de su abdomen tambin puede sentirse entumecida. Puede sentir que le falta el aire debido a que se expande el tero. Otros cambios Puede tener necesidad de orinar con ms frecuencia porque el feto baja hacia la pelvis y ejerce presin sobre la vejiga. Puede tener ms problemas para dormir. Esto puede deberse al tamao de su abdomen, una mayor necesidad de orinar y un aumento en el metabolismo de su cuerpo. Puede notar que el feto "baja" o lo siente ms bajo, en el abdomen (aligeramiento). Puede tener un aumento de la secrecin vaginal. Puede notar que tiene dolor alrededor del hueso plvico a medida que el tero se distiende. Siga estas instrucciones en su casa: Medicamentos Siga las instrucciones del mdico en relacin con el uso de medicamentos. Durante el embarazo, hay medicamentos que pueden tomarse y otros que no. No tome ningn medicamento a menos que lo haya autorizado el mdico. Tome vitaminas prenatales que contengan por lo menos 600microgramos (mcg) de cido flico. Comida y bebida Lleve una dieta saludable que incluya frutas y verduras frescas, cereales integrales, buenas fuentes de protenas como carnes magras, huevos o tofu, y productos lcteos descremados. Evite la carne cruda y el jugo, la leche y el queso sin pasteurizar. Estos portan grmenes que pueden provocar dao tanto a usted como al beb. Tome 4 o 5 comidas pequeas en lugar de 3 comidas abundantes al da. Es posible que tenga que tomar estas medidas para prevenir o tratar el estreimiento: Beber suficiente lquido como para mantener la orina   de color amarillo plido. Consumir alimentos ricos en fibra, como frijoles, cereales integrales, y frutas y verduras frescas. Limitar el consumo de alimentos ricos en grasa y azcares procesados, como los alimentos fritos o dulces. Actividad Haga ejercicio solamente como se lo haya indicado el mdico. La mayora de las personas  pueden continuar su actividad fsica habitual durante el embarazo. Intente realizar como mnimo 30minutos de actividad fsica por lo menos 5das a la semana. Deje de hacer ejercicio si experimenta contracciones en el tero. Deje de hacer ejercicio si le aparecen dolor o clicos en la parte baja del vientre o de la espalda. Evite levantar pesos excesivos. No haga ejercicio si hace mucho calor o humedad, o si se encuentra a una altitud elevada. Si lo desea, puede seguir teniendo relaciones sexuales, salvo que el mdico le indique lo contrario. Alivio del dolor y del malestar Haga pausas frecuentes y descanse con las piernas levantadas (elevadas) si tiene calambres en las piernas o dolor en la parte baja de la espalda. Dese baos de asiento con agua tibia para aliviar el dolor o las molestias causadas por las hemorroides. Use una crema para las hemorroides si el mdico la autoriza. Use un sujetador que le brinde buen soporte para prevenir las molestias causadas por la sensibilidad en las mamas. Si tiene venas varicosas: Use medias de compresin como se lo haya indicado el mdico. Eleve los pies durante 15minutos, 3 o 4veces por da. Limite el consumo de sal en su dieta. Seguridad Hable con su mdico antes de viajar distancias largas. No se d baos de inmersin en agua caliente, baos turcos ni saunas. Use el cinturn de seguridad en todo momento mientras conduce o va en auto. Hable con el mdico si es vctima de maltrato verbal o fsico. Preparacin para el nacimiento Para prepararse para la llegada de su beb: Tome clases prenatales para entender, practicar, y hacer preguntas sobre el trabajo de parto y el parto. Visite el hospital y recorra el rea de maternidad. Compre un asiento de seguridad orientado hacia atrs, y asegrese de saber cmo instalarlo en su automvil. Prepare la habitacin o el lugar donde dormir el beb. Asegrese de quitar todas las almohadas y animales de peluche de  la cuna del beb para evitar la asfixia. Indicaciones generales Evite el contacto con las bandejas sanitarias de los gatos y la tierra que estos animales usan. Estos alimentos contienen bacterias que pueden causar defectos congnitos en el beb. Si tiene un gato, pdale a alguien que limpie la caja de arena por usted. No se haga lavados vaginales ni use tampones. No use toallas higinicas perfumadas. No consuma ningn producto que contenga nicotina o tabaco, como cigarrillos, cigarrillos electrnicos y tabaco de mascar. Si necesita ayuda para dejar de consumir estos productos, consulte al mdico. No use ningn remedio a base de hierbas, drogas ilegales o medicamentos que no le hayan sido recetados. Las sustancias qumicas de estos productos pueden daar al beb. No beba alcohol. Le realizarn exmenes prenatales ms frecuentes durante el tercer trimestre. Durante una visita prenatal de rutina, el mdico le har un examen fsico, le realizar pruebas y hablar con usted de su salud general. Cumpla con todas las visitas de seguimiento. Esto es importante. Dnde buscar ms informacin American Pregnancy Association (Asociacin Estadounidense del Embarazo): americanpregnancy.org American College of Obstetricians and Gynecologists (Colegio Estadounidense de Obstetras y Gineclogos): acog.org/womens-health/pregnancy? Office on Women's Health (Oficina para la Salud de la Mujer): womenshealth.gov/pregnancy Comunquese con un mdico si tiene: Fiebre. Clicos leves en la   pelvis, presin en la pelvis o dolor persistente en la zona abdominal o la parte baja de la espalda. Vmitos o diarrea. Secrecin vaginal con mal olor u orina con mal olor. Dolor al orinar. Un dolor de cabeza que no desaparece despus de tomar analgsicos. Cambios en la visin o ve manchas delante de los ojos. Solicite ayuda de inmediato si: Rompe la bolsa. Tiene contracciones regulares separadas por menos de 5minutos. Tiene sangrado o  pequeas prdidas vaginales. Siente un dolor abdominal intenso. Tiene dificultad para respirar. Siente dolor en el pecho. Sufre episodios de desmayo. No ha sentido a su beb moverse durante el perodo de tiempo que le indic el mdico. Tiene dolor, hinchazn o enrojecimiento nuevos en un brazo o una pierna o se produce un aumento de alguno de estos sntomas. Resumen El tercer trimestre del embarazo comprende desde la semana28 hasta la semana 40 (desde el mes7 hasta el mes9). Puede tener ms problemas para dormir. Esto puede deberse al tamao de su abdomen, una mayor necesidad de orinar y un aumento en el metabolismo de su cuerpo. Le realizarn exmenes prenatales ms frecuentes durante el tercer trimestre. Cumpla con todas las visitas de seguimiento. Esto es importante. Esta informacin no tiene como fin reemplazar el consejo del mdico. Asegrese de hacerle al mdico cualquier pregunta que tenga. Document Revised: 02/24/2020 Document Reviewed: 02/24/2020 Elsevier Patient Education  2022 Elsevier Inc.  

## 2021-07-04 ENCOUNTER — Encounter: Payer: Self-pay | Admitting: Obstetrics & Gynecology

## 2021-07-04 LAB — CULTURE, OB URINE

## 2021-07-04 LAB — URINE CULTURE, OB REFLEX: Organism ID, Bacteria: NO GROWTH

## 2021-07-17 ENCOUNTER — Other Ambulatory Visit: Payer: Self-pay

## 2021-07-17 ENCOUNTER — Ambulatory Visit (INDEPENDENT_AMBULATORY_CARE_PROVIDER_SITE_OTHER): Payer: Self-pay | Admitting: Family Medicine

## 2021-07-17 VITALS — BP 94/63 | HR 83 | Wt 133.0 lb

## 2021-07-17 DIAGNOSIS — Z6791 Unspecified blood type, Rh negative: Secondary | ICD-10-CM

## 2021-07-17 DIAGNOSIS — Z789 Other specified health status: Secondary | ICD-10-CM

## 2021-07-17 DIAGNOSIS — O0990 Supervision of high risk pregnancy, unspecified, unspecified trimester: Secondary | ICD-10-CM

## 2021-07-17 DIAGNOSIS — O26893 Other specified pregnancy related conditions, third trimester: Secondary | ICD-10-CM

## 2021-07-17 NOTE — Progress Notes (Signed)
ROB [redacted]w[redacted]d  CC: pain in hands daily. More at night.  Pt feels as if "baby is low" .  Declined cervix check today.

## 2021-07-17 NOTE — Progress Notes (Signed)
    PRENATAL VISIT NOTE  Subjective:  Sue Moore is a 35 y.o. G3P0020 at [redacted]w[redacted]d being seen today for ongoing prenatal care.  She is currently monitored for the following issues for this low-risk pregnancy and has Rh negative status during pregnancy; Marijuana use; Language barrier affecting health care; Supervision of high risk pregnancy, antepartum; Multigravida of advanced maternal age in third trimester; Anorexia; Bulimia; and Anxiety and depression on their problem list.  Patient reports  pelvic pressure and carpal tunnel .  Contractions: Irritability. Vag. Bleeding: None.  Movement: Present. Denies leaking of fluid.   The following portions of the patient's history were reviewed and updated as appropriate: allergies, current medications, past family history, past medical history, past social history, past surgical history and problem list.   Objective:   Vitals:   07/17/21 1515  BP: 94/63  Pulse: 83  Weight: 133 lb (60.3 kg)    Fetal Status: Fetal Heart Rate (bpm): 140 Fundal Height: 32 cm Movement: Present     General:  Alert, oriented and cooperative. Patient is in no acute distress.  Skin: Skin is warm and dry. No rash noted.   Cardiovascular: Normal heart rate noted  Respiratory: Normal respiratory effort, no problems with respiration noted  Abdomen: Soft, gravid, appropriate for gestational age.  Pain/Pressure: Present     Pelvic: Cervical exam deferred        Extremities: Normal range of motion.  Edema: Deep pitting, indentation remains for a short time  Mental Status: Normal mood and affect. Normal behavior. Normal judgment and thought content.   Assessment and Plan:  Pregnancy: G3P0020 at [redacted]w[redacted]d 1. Supervision of high risk pregnancy, antepartum Continue prenatal care. Usual discomforts discussed Cock-up wrist splints  2. Language barrier affecting health care Spanish interpreter: Husband per patient request used--she speaks good English   3. Rh  negative status during pregnancy in third trimester S/p Rhogam  Preterm labor symptoms and general obstetric precautions including but not limited to vaginal bleeding, contractions, leaking of fluid and fetal movement were reviewed in detail with the patient. Please refer to After Visit Summary for other counseling recommendations.   Return in 1 week (on 07/24/2021).  Future Appointments  Date Time Provider Department Center  07/22/2021  3:10 PM Anyanwu, Jethro Bastos, MD [facility]  07/31/2021  8:55 AM Reva Bores, MD [facility]  08/07/2021  2:50 PM Anyanwu, Jethro Bastos, MD [facility]    Reva Bores, MD

## 2021-07-17 NOTE — Patient Instructions (Signed)

## 2021-07-22 ENCOUNTER — Other Ambulatory Visit: Payer: Self-pay

## 2021-07-22 ENCOUNTER — Encounter: Payer: Self-pay | Admitting: Obstetrics & Gynecology

## 2021-07-22 ENCOUNTER — Other Ambulatory Visit (HOSPITAL_COMMUNITY)
Admission: RE | Admit: 2021-07-22 | Discharge: 2021-07-22 | Disposition: A | Discharge status: Home / Self Care | Payer: Self-pay | Source: Ambulatory Visit | Attending: Obstetrics & Gynecology | Admitting: Obstetrics & Gynecology

## 2021-07-22 ENCOUNTER — Ambulatory Visit (INDEPENDENT_AMBULATORY_CARE_PROVIDER_SITE_OTHER): Payer: Self-pay | Admitting: Obstetrics & Gynecology

## 2021-07-22 VITALS — BP 101/66 | HR 69 | Wt 133.0 lb

## 2021-07-22 DIAGNOSIS — O0990 Supervision of high risk pregnancy, unspecified, unspecified trimester: Secondary | ICD-10-CM | POA: Insufficient documentation

## 2021-07-22 DIAGNOSIS — Z3A36 36 weeks gestation of pregnancy: Secondary | ICD-10-CM

## 2021-07-22 DIAGNOSIS — O09523 Supervision of elderly multigravida, third trimester: Secondary | ICD-10-CM

## 2021-07-22 NOTE — Progress Notes (Signed)
   PRENATAL VISIT NOTE  Subjective:  Sue Moore is a 35 y.o. G3P0020 at [redacted]w[redacted]d being seen today for ongoing prenatal care.  Patient is Spanish-speaking only, husband present to interpret for this encounter as per her desire and consent. She is currently monitored for the following issues for this low-risk pregnancy and has Rh negative status during pregnancy; Marijuana use; Language barrier affecting health care; Supervision of high risk pregnancy, antepartum; Multigravida of advanced maternal age in third trimester; Anorexia; Bulimia; and Anxiety and depression on their problem list.  Patient reports no complaints.  Contractions: Irritability. Vag. Bleeding: None.  Movement: Present. Denies leaking of fluid.   The following portions of the patient's history were reviewed and updated as appropriate: allergies, current medications, past family history, past medical history, past social history, past surgical history and problem list.   Objective:   Vitals:   07/22/21 1514  BP: 101/66  Pulse: 69  Weight: 133 lb (60.3 kg)    Fetal Status: Fetal Heart Rate (bpm): 131 Fundal Height: 36 cm Movement: Present  Presentation: Vertex confirmed on bedside ultrasound  General:  Alert, oriented and cooperative. Patient is in no acute distress.  Skin: Skin is warm and dry. No rash noted.   Cardiovascular: Normal heart rate noted  Respiratory: Normal respiratory effort, no problems with respiration noted  Abdomen: Soft, gravid, appropriate for gestational age.  Pain/Pressure: Present     Pelvic: Pelvic cultures collected with chaperone present. Cervical exam deferred        Extremities: Normal range of motion.  Edema: Moderate pitting, indentation subsides rapidly  Mental Status: Normal mood and affect. Normal behavior. Normal judgment and thought content.   Assessment and Plan:  Pregnancy: G3P0020 at [redacted]w[redacted]d 1. Multigravida of advanced maternal age in third trimester 2. [redacted] weeks  gestation of pregnancy 3. Supervision of high risk pregnancy, antepartum Pelvic cultures done today. Cephalic presentation confirmed on bedside scan. - Strep Gp B NAA - GC/Chlamydia probe amp ([place])not at [facility] Preterm labor symptoms and general obstetric precautions including but not limited to vaginal bleeding, contractions, leaking of fluid and fetal movement were reviewed in detail with the patient. Please refer to After Visit Summary for other counseling recommendations.   Return in about 1 week (around 07/29/2021) for OFFICE OB VISIT (MD only).  Future Appointments  Date Time Provider Department Center  07/31/2021  8:55 AM Reva Bores, MD [facility]  08/07/2021  2:50 PM Lyndall Windt, Jethro Bastos, MD [facility]    Jaynie Collins, MD

## 2021-07-22 NOTE — Patient Instructions (Signed)
Signos y síntomas del trabajo de parto °Signs and Symptoms of Labor °El trabajo de parto es el proceso natural del cuerpo para sacar al bebé y a la placenta del útero. Por lo general, el proceso del trabajo de parto comienza cuando el embarazo ha llegado a su término, entre las semanas 39 y 41 del embarazo. °Signos y síntomas de que está cerca de empezar el trabajo de parto °A medida que el cuerpo se prepara para el trabajo de parto y el nacimiento del bebé, puede notar los siguientes síntomas en las semanas y días anteriores al trabajo de parto propiamente dicho: °Eliminación de una pequeña cantidad de mucosidad espesa y sanguinolenta por la vagina. A esto se lo llama aparición normal de sangre o pérdida del tapón mucoso. Esto puede suceder más de una semana antes de que comience el trabajo de parto, o justo antes de que comience el trabajo de parto a medida que el cuello uterino comienza a ensancharse (dilatarse). En algunas mujeres, el tapón mucoso sale entero de una sola vez. En otras, pueden salir partes del tapón mucoso de forma gradual durante varios días. °El bebé se mueve (desciende) a la parte inferior de la pelvis para ponerse en posición para el nacimiento (aligeramiento). Cuando esto sucede, puede sentir más presión en la vejiga y el hueso pélvico, y menos presión en las costillas. Esto facilitará la respiración. También puede hacer que necesite orinar con más frecuencia y que tenga problemas para realizar deposiciones. °Tener “contracciones de práctica”, también llamadas contracciones de Braxton Hicks, o trabajo de parto falso. Estas se producen a intervalos irregulares (espaciadas de modo desigual) con una diferencia de más de 10 minutos. Las contracciones de trabajo de parto falso son frecuentes después del ejercicio o la actividad sexual. Se detendrán si cambia de posición, descansa o bebe líquidos. Estas contracciones son generalmente leves y no se tornan más fuertes con el tiempo. Pueden sentirse  como lo siguiente: °Un dolor de espalda. °Calambres leves, similares a los dolores menstruales. °Tirantez o presión en el abdomen. °Otros síntomas tempranos pueden ser los siguientes: °Náuseas o pérdida del apetito. °Diarrea. °Una repentina explosión de energía o sentirse muy cansada. °Cambios en el humor. °Problemas para dormir. °Signos y síntomas de que ha comenzado el trabajo de parto °Los signos de que está en trabajo de parto pueden incluir los siguientes: °Contracciones a intervalos regulares (espaciadas de modo regular) que se incrementan en intensidad. Esto puede sentirse como presión o estrechamiento intenso en el abdomen, que se desplaza hacia la espalda. °Las contracciones pueden sentirse también como dolor rítmico en la parte superior de los muslos y la espalda que va y viene a intervalos regulares. °Si es la primera vez que da a luz, este cambio en la intensidad de las contracciones ocurre generalmente a un ritmo más gradual. °Si ya ha dado a luz antes, puede notar una progresión más rápida de los cambios de las contracciones. °Sensación de presión en el área vaginal. °Ruptura de la bolsa (ruptura de las membranas). Se produce cuando el saco de líquido que rodea al bebé se rompe. La pérdida de líquido de la vagina puede ser transparente o estar teñida de sangre. Generalmente el trabajo de parto comienza 24 horas después de la ruptura de bolsa, pero puede tomar más tiempo en comenzar. °Algunas personas pueden sentir un chorro repentino de líquido; otras pueden observar ropa interior húmeda de forma repetida. °Siga estas instrucciones en su casa: ° °Cuando comience el trabajo de parto o si rompe bolsa, llame   al médico o a la línea de atención de enfermería. Ellos determinarán, en función de su situación, cuándo debe ir a hacerse un examen. °Durante el trabajo de parto temprano, es posible que pueda descansar y manejar los síntomas en su casa. Algunas estrategias para probar en su casa incluyen: °Técnicas  de respiración y relajación. °Tomar una ducha o un baño de inmersión tibios. °Escuchar música. °Usar una almohadilla térmica en la espalda para aliviar el dolor. Si se lo indican, aplique calor en la zona con la frecuencia que le haya indicado el médico. Use la fuente de calor que el médico le recomiende, como una compresa de calor húmedo o una almohadilla térmica. °Coloque una toalla entre la piel y la fuente de calor. °Aplique calor durante 20 a 30 minutos. °Retire la fuente de calor si la piel se pone de color rojo brillante. Esto es especialmente importante si no puede sentir dolor, calor o frío. Corre un mayor riesgo de sufrir quemaduras. °Comuníquese con un médico si: °Comenzó el trabajo de parto. °Rompe la bolsa. °Tiene náuseas, vómitos o diarrea. °Solicite ayuda de inmediato si: °Tiene contracciones dolorosas y regulares cada 5 minutos o menos. °El trabajo de parto comienza antes de que se cumplan las 37 semanas de embarazo. °Tiene fiebre. °Elimina coágulos de sangre de color rojo brillante por la vagina. °No siente que el bebé se mueva. °Tiene dolor de cabeza intenso con o sin problemas de visión. °Siente falta de aire o dolor en el pecho. °Estos síntomas pueden representar un problema grave que constituye una emergencia. No espere a ver si los síntomas desaparecen. Solicite atención médica de inmediato. Comuníquese con el servicio de emergencias de su localidad (911 en los [place]). No conduzca por sus propios medios hasta el hospital. °Resumen °El trabajo de parto es el proceso natural del cuerpo por el cual se saca al bebé y la placenta del útero. °Por lo general, el proceso del trabajo de parto comienza cuando el embarazo ha llegado a su término, entre las semanas 39 y 40 de embarazo. °Cuando comience el trabajo de parto o si rompe bolsa, llame al médico o a la línea de atención de enfermería. Ellos determinarán, en función de su situación, cuándo debe ir a hacerse un examen. °Esta información no  tiene como fin reemplazar el consejo del médico. Asegúrese de hacerle al médico cualquier pregunta que tenga. °Document Revised: 01/22/2021 Document Reviewed: 01/22/2021 °Elsevier Patient Education © 2022 Elsevier Inc. ° °

## 2021-07-24 LAB — GC/CHLAMYDIA PROBE AMP (~~LOC~~) NOT AT ARMC
Chlamydia: NEGATIVE
Comment: NEGATIVE
Comment: NORMAL
Neisseria Gonorrhea: NEGATIVE

## 2021-07-24 LAB — STREP GP B NAA: Strep Gp B NAA: NEGATIVE

## 2021-07-31 ENCOUNTER — Other Ambulatory Visit: Payer: Self-pay

## 2021-07-31 ENCOUNTER — Ambulatory Visit (INDEPENDENT_AMBULATORY_CARE_PROVIDER_SITE_OTHER): Payer: Self-pay | Admitting: Family Medicine

## 2021-07-31 ENCOUNTER — Encounter: Payer: Self-pay | Admitting: Family Medicine

## 2021-07-31 VITALS — BP 104/68 | HR 60 | Wt 137.0 lb

## 2021-07-31 DIAGNOSIS — Z789 Other specified health status: Secondary | ICD-10-CM

## 2021-07-31 DIAGNOSIS — O09523 Supervision of elderly multigravida, third trimester: Secondary | ICD-10-CM

## 2021-07-31 DIAGNOSIS — O0990 Supervision of high risk pregnancy, unspecified, unspecified trimester: Secondary | ICD-10-CM

## 2021-07-31 DIAGNOSIS — O26893 Other specified pregnancy related conditions, third trimester: Secondary | ICD-10-CM

## 2021-07-31 DIAGNOSIS — Z6791 Unspecified blood type, Rh negative: Secondary | ICD-10-CM

## 2021-07-31 NOTE — Progress Notes (Signed)
   PRENATAL VISIT NOTE  Subjective:  Sue Moore is a 35 y.o. G3P0020 at [redacted]w[redacted]d being seen today for ongoing prenatal care.  She is currently monitored for the following issues for this high-risk pregnancy and has Rh negative status during pregnancy; Marijuana use; Language barrier affecting health care; Supervision of high risk pregnancy, antepartum; Multigravida of advanced maternal age in third trimester; Anorexia; Bulimia; and Anxiety and depression on their problem list.  Patient reports backache.  Contractions: Irritability. Vag. Bleeding: None.  Movement: Present. Denies leaking of fluid.   The following portions of the patient's history were reviewed and updated as appropriate: allergies, current medications, past family history, past medical history, past social history, past surgical history and problem list.   Objective:   Vitals:   07/31/21 0918  BP: 104/68  Pulse: 60  Weight: 137 lb (62.1 kg)    Fetal Status: Fetal Heart Rate (bpm): 130 Fundal Height: 33 cm Movement: Present  Presentation: Vertex  General:  Alert, oriented and cooperative. Patient is in no acute distress.  Skin: Skin is warm and dry. No rash noted.   Cardiovascular: Normal heart rate noted  Respiratory: Normal respiratory effort, no problems with respiration noted  Abdomen: Soft, gravid, appropriate for gestational age.  Pain/Pressure: Present     Pelvic: Cervical exam deferred        Extremities: Normal range of motion.  Edema: Moderate pitting, indentation subsides rapidly  Mental Status: Normal mood and affect. Normal behavior. Normal judgment and thought content.   Assessment and Plan:  Pregnancy: G3P0020 at [redacted]w[redacted]d 1. Language barrier affecting health care Spanish interpreter: Feliz Beam (husband) used   2. Rh negative status during pregnancy in third trimester RH negative  3. Multigravida of advanced maternal age in third trimester Declined genetics  4. Supervision of high risk  pregnancy, antepartum Continue routine prenatal care.   Term labor symptoms and general obstetric precautions including but not limited to vaginal bleeding, contractions, leaking of fluid and fetal movement were reviewed in detail with the patient. Please refer to After Visit Summary for other counseling recommendations.   Return in 1 week (on 08/07/2021).  Future Appointments  Date Time Provider Department Center  08/07/2021  2:50 PM Anyanwu, Jethro Bastos, MD [facility]  08/13/2021 11:35 AM [place] Bing, MD [facility]    Reva Bores, MD

## 2021-08-07 ENCOUNTER — Encounter: Payer: Self-pay | Admitting: Obstetrics & Gynecology

## 2021-08-07 ENCOUNTER — Ambulatory Visit (INDEPENDENT_AMBULATORY_CARE_PROVIDER_SITE_OTHER): Payer: Self-pay | Admitting: Obstetrics & Gynecology

## 2021-08-07 ENCOUNTER — Other Ambulatory Visit: Payer: Self-pay

## 2021-08-07 VITALS — BP 106/66 | HR 89 | Wt 137.0 lb

## 2021-08-07 DIAGNOSIS — Z3A38 38 weeks gestation of pregnancy: Secondary | ICD-10-CM

## 2021-08-07 DIAGNOSIS — O0990 Supervision of high risk pregnancy, unspecified, unspecified trimester: Secondary | ICD-10-CM

## 2021-08-07 DIAGNOSIS — O09523 Supervision of elderly multigravida, third trimester: Secondary | ICD-10-CM

## 2021-08-07 NOTE — Patient Instructions (Signed)

## 2021-08-07 NOTE — Progress Notes (Signed)
   PRENATAL VISIT NOTE  Subjective:  Sue Moore is a 35 y.o. G3P0020 at [redacted]w[redacted]d being seen today for ongoing prenatal care.    Patient is Spanish-speaking only, husband present to interpret for this encounter as per her desire and consent. She is currently monitored for the following issues for this low-risk pregnancy and has Rh negative status during pregnancy; Marijuana use; Language barrier affecting health care; Supervision of high risk pregnancy, antepartum; Multigravida of advanced maternal age in third trimester; Anorexia; Bulimia; and Anxiety and depression on their problem list.  Patient reports occasional contractions.  Contractions: Irritability. Vag. Bleeding: None.  Movement: Present. Denies leaking of fluid.   The following portions of the patient's history were reviewed and updated as appropriate: allergies, current medications, past family history, past medical history, past social history, past surgical history and problem list.   Objective:   Vitals:   08/07/21 1454  BP: 106/66  Pulse: 89  Weight: 137 lb (62.1 kg)    Fetal Status: Fetal Heart Rate (bpm): 148 Fundal Height: 37 cm Movement: Present  Presentation: Vertex  General:  Alert, oriented and cooperative. Patient is in no acute distress.  Skin: Skin is warm and dry. No rash noted.   Cardiovascular: Normal heart rate noted  Respiratory: Normal respiratory effort, no problems with respiration noted  Abdomen: Soft, gravid, appropriate for gestational age.  Pain/Pressure: Present     Pelvic: Cervical exam performed in the presence of a chaperone Dilation: Closed Effacement (%): Thick Station: -3  Extremities: Normal range of motion.  Edema: Moderate pitting, indentation subsides rapidly  Mental Status: Normal mood and affect. Normal behavior. Normal judgment and thought content.   Assessment and Plan:  Pregnancy: G3P0020 at [redacted]w[redacted]d 1. Multigravida of advanced maternal age in third trimester 2. [redacted]  weeks gestation of pregnancy 3. Supervision of high risk pregnancy, antepartum Patient desires IOL at 40 weeks, she was told she can go up to 41 weeks to maximize chances of spontaneous labor, also talked about need for post term BPP.  She still desired IOL at 40 weeks.  Risks and benefits of induction were reviewed..  Patient understand these risks and wish to proceed.   Induction of labor scheduled for 08/18/21 at midnight, orders have been signed and held. She was told to expect a call from [facility] L&D with further instructions about her pre-admission COVID screening and any further instructions about the induction of labor.   Term labor symptoms and general obstetric precautions including but not limited to vaginal bleeding, contractions, leaking of fluid and fetal movement were reviewed in detail with the patient. Please refer to After Visit Summary for other counseling recommendations.   Return in about 1 week (around 08/14/2021) for OFFICE OB VISIT (MD only) as scheduled.  IOL scheduled 08/18/21.  Future Appointments  Date Time Provider Department Center  08/13/2021 11:35 AM [place] Bing, MD [facility]    Jaynie Collins, MD

## 2021-08-09 ENCOUNTER — Other Ambulatory Visit (HOSPITAL_COMMUNITY): Payer: Self-pay | Admitting: Advanced Practice Midwife

## 2021-08-13 ENCOUNTER — Other Ambulatory Visit: Payer: Self-pay

## 2021-08-13 ENCOUNTER — Ambulatory Visit (INDEPENDENT_AMBULATORY_CARE_PROVIDER_SITE_OTHER): Payer: Self-pay | Admitting: Obstetrics and Gynecology

## 2021-08-13 VITALS — BP 109/71 | HR 67 | Wt 142.0 lb

## 2021-08-13 DIAGNOSIS — Z3A39 39 weeks gestation of pregnancy: Secondary | ICD-10-CM

## 2021-08-14 NOTE — Progress Notes (Signed)
° °  PRENATAL VISIT NOTE  Subjective:  Sue Moore is a 35 y.o. G3P0020 at [redacted]w[redacted]d being seen today for ongoing prenatal care.  She is currently monitored for the following issues for this low-risk pregnancy and has Rh negative status during pregnancy; Marijuana use; Language barrier affecting health care; Supervision of high risk pregnancy, antepartum; Multigravida of advanced maternal age in third trimester; Anorexia; Bulimia; and Anxiety and depression on their problem list.  Patient reports no complaints.  Contractions: Irritability. Vag. Bleeding: None.  Movement: Present. Denies leaking of fluid.   The following portions of the patient's history were reviewed and updated as appropriate: allergies, current medications, past family history, past medical history, past social history, past surgical history and problem list.   Objective:   Vitals:   08/13/21 1124  BP: 109/71  Pulse: 67  Weight: 142 lb (64.4 kg)    Fetal Status: Fetal Heart Rate (bpm): 127 Fundal Height: 39 cm Movement: Present  Presentation: Vertex  General:  Alert, oriented and cooperative. Patient is in no acute distress.  Skin: Skin is warm and dry. No rash noted.   Cardiovascular: Normal heart rate noted  Respiratory: Normal respiratory effort, no problems with respiration noted  Abdomen: Soft, gravid, appropriate for gestational age.  Pain/Pressure: Present     Pelvic: Cervical exam deferred        Extremities: Normal range of motion.  Edema: Moderate pitting, indentation subsides rapidly  Mental Status: Normal mood and affect. Normal behavior. Normal judgment and thought content.   Assessment and Plan:  Pregnancy: G3P0020 at [redacted]w[redacted]d 1. [redacted] weeks gestation of pregnancy GBS neg. Has elective IOL already set up for 12/18. Rhogam PP PRN  Term labor symptoms and general obstetric precautions including but not limited to vaginal bleeding, contractions, leaking of fluid and fetal movement were reviewed in  detail with the patient. Please refer to After Visit Summary for other counseling recommendations.   No follow-ups on file.  Future Appointments  Date Time Provider Department Center  08/18/2021 12:00 AM [facility] SCHED ROOM [facility] None    [place] Bing, MD

## 2021-08-18 ENCOUNTER — Inpatient Hospital Stay (HOSPITAL_COMMUNITY)
Admission: AD | Admit: 2021-08-18 | Payer: BC Managed Care – PPO | Source: Home / Self Care | Admitting: Obstetrics & Gynecology

## 2021-08-18 ENCOUNTER — Inpatient Hospital Stay (HOSPITAL_COMMUNITY): Payer: BC Managed Care – PPO

## 2021-08-19 ENCOUNTER — Encounter (HOSPITAL_COMMUNITY): Payer: Self-pay | Admitting: Obstetrics & Gynecology

## 2021-08-19 ENCOUNTER — Inpatient Hospital Stay (HOSPITAL_COMMUNITY): Payer: BC Managed Care – PPO | Admitting: Anesthesiology

## 2021-08-19 ENCOUNTER — Inpatient Hospital Stay (HOSPITAL_COMMUNITY)
Admission: AD | Admit: 2021-08-19 | Discharge: 2021-08-21 | DRG: 807 | Disposition: A | Discharge status: Home / Self Care | Payer: BC Managed Care – PPO | Attending: Obstetrics & Gynecology | Admitting: Obstetrics & Gynecology

## 2021-08-19 ENCOUNTER — Other Ambulatory Visit: Payer: Self-pay

## 2021-08-19 DIAGNOSIS — O26893 Other specified pregnancy related conditions, third trimester: Secondary | ICD-10-CM | POA: Diagnosis present

## 2021-08-19 DIAGNOSIS — O09523 Supervision of elderly multigravida, third trimester: Secondary | ICD-10-CM | POA: Diagnosis present

## 2021-08-19 DIAGNOSIS — Z87891 Personal history of nicotine dependence: Secondary | ICD-10-CM | POA: Diagnosis not present

## 2021-08-19 DIAGNOSIS — O480 Post-term pregnancy: Secondary | ICD-10-CM | POA: Diagnosis present

## 2021-08-19 DIAGNOSIS — Z3A40 40 weeks gestation of pregnancy: Secondary | ICD-10-CM | POA: Diagnosis not present

## 2021-08-19 DIAGNOSIS — Z20822 Contact with and (suspected) exposure to covid-19: Secondary | ICD-10-CM | POA: Diagnosis present

## 2021-08-19 DIAGNOSIS — Z6791 Unspecified blood type, Rh negative: Secondary | ICD-10-CM | POA: Diagnosis not present

## 2021-08-19 LAB — TYPE AND SCREEN
ABO/RH(D): AB NEG
Antibody Screen: POSITIVE

## 2021-08-19 LAB — CBC
HCT: 34 % — ABNORMAL LOW (ref 36.0–46.0)
Hemoglobin: 11.4 g/dL — ABNORMAL LOW (ref 12.0–15.0)
MCH: 30.1 pg (ref 26.0–34.0)
MCHC: 33.5 g/dL (ref 30.0–36.0)
MCV: 89.7 fL (ref 80.0–100.0)
Platelets: 202 10*3/uL (ref 150–400)
RBC: 3.79 MIL/uL — ABNORMAL LOW (ref 3.87–5.11)
RDW: 13.9 % (ref 11.5–15.5)
WBC: 6.6 10*3/uL (ref 4.0–10.5)
nRBC: 0 % (ref 0.0–0.2)

## 2021-08-19 LAB — RPR: RPR Ser Ql: NONREACTIVE

## 2021-08-19 LAB — RESP PANEL BY RT-PCR (FLU A&B, COVID) ARPGX2
Influenza A by PCR: NEGATIVE
Influenza B by PCR: NEGATIVE
SARS Coronavirus 2 by RT PCR: NEGATIVE

## 2021-08-19 MED ORDER — LACTATED RINGERS IV SOLN: INTRAVENOUS | Status: DC

## 2021-08-19 MED ORDER — PHENYLEPHRINE 40 MCG/ML (10ML) SYRINGE FOR IV PUSH (FOR BLOOD PRESSURE SUPPORT)
80.0000 ug | PREFILLED_SYRINGE | INTRAVENOUS | Status: DC | PRN
  Filled 2021-08-19: qty 10

## 2021-08-19 MED ORDER — OXYCODONE-ACETAMINOPHEN 5-325 MG PO TABS: 1.0000 | ORAL_TABLET | ORAL | Status: DC | PRN

## 2021-08-19 MED ORDER — LIDOCAINE HCL (PF) 1 % IJ SOLN: 30.0000 mL | INTRAMUSCULAR | Status: DC | PRN

## 2021-08-19 MED ORDER — LACTATED RINGERS IV SOLN: 500.0000 mL | Freq: Once | INTRAVENOUS | Status: DC

## 2021-08-19 MED ORDER — MISOPROSTOL 25 MCG QUARTER TABLET
25.0000 ug | ORAL_TABLET | ORAL | Status: DC | PRN
  Administered 2021-08-19: 02:00:00 25 ug via VAGINAL
  Filled 2021-08-19: qty 1

## 2021-08-19 MED ORDER — FENTANYL-BUPIVACAINE-NACL 0.5-0.125-0.9 MG/250ML-% EP SOLN
12.0000 mL/h | EPIDURAL | Status: DC | PRN
  Administered 2021-08-19: 12:00:00 12 mL/h via EPIDURAL
  Filled 2021-08-19: qty 250

## 2021-08-19 MED ORDER — SOD CITRATE-CITRIC ACID 500-334 MG/5ML PO SOLN
30.0000 mL | ORAL | Status: DC | PRN
  Filled 2021-08-19: qty 30

## 2021-08-19 MED ORDER — HYDROXYZINE HCL 50 MG PO TABS: 50.0000 mg | ORAL_TABLET | Freq: Four times a day (QID) | ORAL | Status: DC | PRN

## 2021-08-19 MED ORDER — OXYTOCIN BOLUS FROM INFUSION
333.0000 mL | Freq: Once | INTRAVENOUS | Status: AC
  Administered 2021-08-20: 01:00:00 333 mL via INTRAVENOUS

## 2021-08-19 MED ORDER — EPHEDRINE 5 MG/ML INJ: 10.0000 mg | INTRAVENOUS | Status: DC | PRN

## 2021-08-19 MED ORDER — OXYTOCIN-SODIUM CHLORIDE 30-0.9 UT/500ML-% IV SOLN: 2.5000 [IU]/h | INTRAVENOUS | Status: DC

## 2021-08-19 MED ORDER — LIDOCAINE-EPINEPHRINE (PF) 2 %-1:200000 IJ SOLN
INTRAMUSCULAR | Status: DC | PRN
  Administered 2021-08-19: 5 mL via EPIDURAL

## 2021-08-19 MED ORDER — METOCLOPRAMIDE HCL 5 MG/ML IJ SOLN
10.0000 mg | Freq: Once | INTRAMUSCULAR | Status: AC
  Administered 2021-08-19: 22:00:00 10 mg via INTRAVENOUS
  Filled 2021-08-19: qty 2

## 2021-08-19 MED ORDER — ONDANSETRON HCL 4 MG/2ML IJ SOLN
4.0000 mg | Freq: Four times a day (QID) | INTRAMUSCULAR | Status: DC | PRN
  Filled 2021-08-19: qty 2

## 2021-08-19 MED ORDER — OXYCODONE-ACETAMINOPHEN 5-325 MG PO TABS: 2.0000 | ORAL_TABLET | ORAL | Status: DC | PRN

## 2021-08-19 MED ORDER — FLEET ENEMA 7-19 GM/118ML RE ENEM: 1.0000 | ENEMA | Freq: Every day | RECTAL | Status: DC | PRN

## 2021-08-19 MED ORDER — OXYTOCIN-SODIUM CHLORIDE 30-0.9 UT/500ML-% IV SOLN
1.0000 m[IU]/min | INTRAVENOUS | Status: DC
Start: 2021-08-19 — End: 2021-08-20
  Administered 2021-08-19: 12:00:00 2 m[IU]/min via INTRAVENOUS
  Filled 2021-08-19: qty 500

## 2021-08-19 MED ORDER — ZOLPIDEM TARTRATE 5 MG PO TABS: 5.0000 mg | ORAL_TABLET | Freq: Every evening | ORAL | Status: DC | PRN

## 2021-08-19 MED ORDER — FENTANYL CITRATE (PF) 100 MCG/2ML IJ SOLN
50.0000 ug | INTRAMUSCULAR | Status: DC | PRN
  Administered 2021-08-19 (×2): 100 ug via INTRAVENOUS
  Administered 2021-08-19: 05:00:00 50 ug via INTRAVENOUS
  Filled 2021-08-19 (×3): qty 2

## 2021-08-19 MED ORDER — TERBUTALINE SULFATE 1 MG/ML IJ SOLN: 0.2500 mg | Freq: Once | INTRAMUSCULAR | Status: DC | PRN

## 2021-08-19 MED ORDER — LACTATED RINGERS IV SOLN: 500.0000 mL | INTRAVENOUS | Status: DC | PRN

## 2021-08-19 MED ORDER — PHENYLEPHRINE 40 MCG/ML (10ML) SYRINGE FOR IV PUSH (FOR BLOOD PRESSURE SUPPORT): 80.0000 ug | PREFILLED_SYRINGE | INTRAVENOUS | Status: DC | PRN

## 2021-08-19 MED ORDER — DIPHENHYDRAMINE HCL 50 MG/ML IJ SOLN: 12.5000 mg | INTRAMUSCULAR | Status: DC | PRN

## 2021-08-19 MED ORDER — ACETAMINOPHEN 325 MG PO TABS: 650.0000 mg | ORAL_TABLET | ORAL | Status: DC | PRN

## 2021-08-19 NOTE — Progress Notes (Signed)
Analiza Cowger MRN: [id number]  Subjective: -Care assumed of 35 y.o. G3P0020 at [redacted]w[redacted]d who presents for IOL s/t PD. Pregnancy and medical history significant for Rh Negative, Anorexia and Bulima.  In room to meet acquaintance of patient and family.  Patient reports some rectal pressure that is intermittent.  SO at bedside, supportive and acting as interpreter.    Objective: BP 133/82    Pulse (!) 55    Temp 98.8 F (37.1 C) (Oral)    Resp 18    Ht 4' 11.06" (1.5 m)    Wt 67.1 kg    LMP 11/11/2020    BMI 29.82 kg/m  No intake/output data recorded. Total I/O In: -  Out: 450 [Urine:450]  Fetal Monitoring: FHT: 120 bpm, Mod Var, +Late Decels, -Accels UC: Q1-3min, palpates strong    Vaginal Exam: SVE:   Dilation: 8 Effacement (%): 100 Station: -1 Exam by:: Adela Glimpse, RN Membranes:AROM x 2hrs Internal Monitors: None  Augmentation/Induction: Pitocin:Infusing at 22mUn/min to 89mUn/min Cytotec: S/P One Dose  Assessment:  IUP at 40.1 weeks Cat II FT  IOL s/t PD Language Barrier  Plan: -Resolution of Cat II FT with position change and bolus. -Decrease pitocin as above. -Continue other mgmt as ordered -Anticipating SVD   Valma Cava, CNM Advanced Practice Provider, Center for [facility] 08/19/2021, 8:46 PM   Addendum 10:10 PM  Nurse call reports patient complete.  Instructed to start pushing.  Decel noted and provider to bedside. Nurse reports patient with n/v when decel occurred. Patient reporting intermittent rectal pressure.  Nurse instructed to give Zofran, but patient SO reports patient had dose at 1900.    135 bpm, Mod Var, +Late Decels, +Accels Ctx Q 2-65min  2nd Stage Labor Nausea   -Will give Reglan for nausea. -Will give patient time to labor down x one hour. -Discussed anticipated rectal pressure.  Instructed to contact provider with constant rectal pressure that worsens with contractions.

## 2021-08-19 NOTE — Progress Notes (Signed)
Sue Moore is a 35 y.o. G3P0020 at [redacted]w[redacted]d by LMP admitted for induction of labor due to Post dates.   Subjective: Resting comfortably with an epidural. Not pressure with contractions. Spouse supportive at bedside.  Objective: BP (!) 105/59    Pulse 61    Temp 98.4 F (36.9 C) (Oral)    Resp 16    Ht 4' 11.06" (1.5 m)    Wt 67.1 kg    LMP 11/11/2020    BMI 29.82 kg/m  No intake/output data recorded. No intake/output data recorded.  FHT:  FHR: 140 bpm, variability: moderate,  accelerations:  Present,  decelerations:  Present early UC:   regular, every 2-3 minutes SVE:   Dilation: 7.5 Effacement (%): 100 Station: -2 Exam by:: Bridgitte Felicetti,cnm AROM with copious amount of clear fluid. Patient tolerated procedure well.  Labs: Lab Results  Component Value Date   WBC 6.6 08/19/2021   HGB 11.4 (L) 08/19/2021   HCT 34.0 (L) 08/19/2021   MCV 89.7 08/19/2021   PLT 202 08/19/2021    Assessment / Plan: Induction of labor due to postterm,  progressing well on pitocin  Labor: Progressing on Pitocin Preeclampsia:   n/a Fetal Wellbeing:  Category I Pain Control:  Epidural I/D:  n/a Anticipated MOD:  NSVD  Sue Moore, CNM 08/19/2021, 6:21 PM

## 2021-08-19 NOTE — Progress Notes (Signed)
Sue Moore is a 35 y.o. G3P0020 at [redacted]w[redacted]d by LMP admitted for induction of labor due to Post dates. Due date 08/18/2021.  Subjective: Patient resting. Pain with contractions; has received 2 doses of Fentanyl already for pain. Patient and spouse verbalizes anxiety about FB insertion. Requesting IV pain medicine, epidural prior to FB insertion or skipping over FB and go straight to Pitocin.  Objective: BP (!) 109/59    Pulse (!) 54    Temp 98.4 F (36.9 C) (Oral)    Resp 18    Ht 4' 11.06" (1.5 m)    Wt 67.1 kg    LMP 11/11/2020    BMI 29.82 kg/m  No intake/output data recorded. No intake/output data recorded.  FHT:  FHR: 115 bpm, variability: minimal ,  accelerations:  Present,  decelerations:  Absent UC:   irregular, every 2-5 minutes SVE:   Dilation: 1.5 Effacement (%): 50 Station: -3 Exam by:: Sue Breach, RN  Labs: Lab Results  Component Value Date   WBC 6.6 08/19/2021   HGB 11.4 (L) 08/19/2021   HCT 34.0 (L) 08/19/2021   MCV 89.7 08/19/2021   PLT 202 08/19/2021    Assessment / Plan: Induction of labor due to postterm,  progressing well on pitocin  Labor: Progressing normally Preeclampsia:   n/a Fetal Wellbeing:  Category I Pain Control:  IV pain meds I/D:  n/a Anticipated MOD:  NSVD Advised starting Pitocin at this dilation is not a optimal option. Discussed with patient and spouse that FB insertion would help advance dilation much quicker and more effectively than Pitocin. Advised that FB does not cause too much pain, it is more discomfort for about 30 -45 mins. Advised to discuss with each other and let RN know what decision is.  Sue Moore, CNM 08/19/2021, 9:08 AM

## 2021-08-19 NOTE — Progress Notes (Addendum)
Nazaret Chea MRN: [id number]  Subjective: -Patient reports increased rectal pressure.  States nausea has improved. Agreeable to start pushing again.   Objective: BP (!) 102/49    Pulse (!) 56    Temp 99.3 F (37.4 C) (Oral)    Resp 18    Ht 4' 11.06" (1.5 m)    Wt 67.1 kg    LMP 11/11/2020    SpO2 98%    BMI 29.82 kg/m  No intake/output data recorded. Total I/O In: -  Out: 450 [Urine:450]  Fetal Monitoring: FHT: 125 bpm, Mod Var, +Late Decel, +Accels UC: Q2-23min    Vaginal Exam: SVE:   Dilation: 10 Effacement (%): 100 Station: Plus 1 Exam by:: Adela Glimpse, RN Membranes:AROM Internal Monitors: None  Augmentation/Induction: Pitocin:80mUn/min Cytotec: S/p  Assessment:  IUP at 40.1 weeks Cat II FT  2nd Stage  Plan: -Will start pushing. -Dr. Katharine Look notified of variable/late decels noted with pushing. Remains on unit. -O2 given with improvement in fetal tracing.  -Continue other mgmt as ordered -Anticipate SVD.   Valma Cava, CNM Advanced Practice Provider, Center for [facility] 08/19/2021, 11:45 PM

## 2021-08-19 NOTE — H&P (Signed)
Sue Moore is a 35 y.o. female G3P0020 with IUP at [redacted]w[redacted]d by presenting for IOL for postdates. She reports positive fetal movement. She denies leakage of fluid or vaginal bleeding.  Prenatal History/Complications: PNC at [facility] Pregnancy complications:  - Past Medical History: Past Medical History:  Diagnosis Date   Anxiety    Depression     Past Surgical History: Past Surgical History:  Procedure Laterality Date   NO PAST SURGERIES      Obstetrical History: OB History     Gravida  3   Para      Term      Preterm      AB  2   Living         SAB  2   IAB      Ectopic      Multiple      Live Births               Social History: Social History   Socioeconomic History   Marital status: Married    Spouse name: Not on file   Number of children: Not on file   Years of education: Not on file   Highest education level: Not on file  Occupational History   Not on file  Tobacco Use   Smoking status: Former   Smokeless tobacco: Former  Building services engineer Use: Not on file  Substance and Sexual Activity   Alcohol use: Not Currently   Drug use: Not Currently    Types: Marijuana    Comment: was daily user in [place]   Sexual activity: Yes  Other Topics Concern   Not on file  Social History Narrative   Not on file   Social Determinants of Health   Financial Resource Strain: Not on file  Food Insecurity: Not on file  Transportation Needs: Not on file  Physical Activity: Not on file  Stress: Not on file  Social Connections: Not on file    Family History: History reviewed. No pertinent family history.  Allergies: No Known Allergies  Medications Prior to Admission  Medication Sig Dispense Refill Last Dose   cyclobenzaprine (FLEXERIL) 10 MG tablet Take 0.5-1 tablets (5-10 mg total) by mouth 3 (three) times daily as needed for muscle spasms. 30 tablet 2 Past Week   pantoprazole (PROTONIX) 20 MG tablet Take 1 tablet (20 mg  total) by mouth daily. 30 tablet 1 Past Week   Prenatal Vit-Fe Fumarate-FA (MULTIVITAMIN-PRENATAL) 27-0.8 MG TABS tablet Take 1 tablet by mouth daily at 12 noon. 90 tablet 1 Past Week    Review of Systems   Constitutional: Negative for fever and chills Eyes: Negative for visual disturbances Respiratory: Negative for shortness of breath, dyspnea Cardiovascular: Negative for chest pain or palpitations  Gastrointestinal: Negative for vomiting, diarrhea and constipation.  POSITIVE for abdominal pain (contractions) Genitourinary: Negative for dysuria and urgency Musculoskeletal: Negative for back pain, joint pain, myalgias  Neurological: Negative for dizziness and headaches  Resp. rate 16, height 4' 11.06" (1.5 m), weight 67.1 kg, last menstrual period 11/11/2020. General appearance: alert, cooperative, and no distress Lungs: normal respiratory effort Heart: regular rate and rhythm Abdomen: soft, non-tender; bowel sounds normal Extremities: Homans sign is negative, no sign of DVT DTR's 2+ Presentation: cephalic Fetal monitoring  130 bpm, mod var, present acel, no decels, no contractions Uterine activity  None Dilation: Closed Effacement (%): Thick Station: Ballotable Exam by:: Sue Moore, CNM   Prenatal labs: ABO, Rh: --/--/PENDING (12/19 0045)  Antibody: PENDING (12/19 0045) Rubella: 3.13 (06/09 1530) RPR: Non Reactive (10/04 0941)  HBsAg: Negative (06/09 1530)  HIV: Non Reactive (10/04 0936)  GBS: Negative/-- (11/21 1629)  1 hr Glucola passed Genetic screening  normal Anatomy US normal  Prenatal Transfer Tool  Maternal Diabetes: No Genetic Screening: Normal Maternal Ultrasounds/Referrals: Normal Fetal Ultrasounds or other Referrals:  Normal Maternal Substance Abuse:  No Significant Maternal Medications:  None Significant Maternal Lab Results: Group B Strep negative  Results for orders placed or performed during the hospital encounter of 08/19/21 (from the past 24  hour(s))  Type and screen   Collection Time: 08/19/21 12:45 AM  Result Value Ref Range   ABO/RH(D) PENDING    Antibody Screen PENDING    Sample Expiration      08/22/2021,2359 Performed at [facility], [address], [place], [place] [address]   CBC   Collection Time: 08/19/21 12:48 AM  Result Value Ref Range   WBC 6.6 4.0 - 10.5 K/uL   RBC 3.79 (L) 3.87 - 5.11 MIL/uL   Hemoglobin 11.4 (L) 12.0 - 15.0 g/dL   HCT 10.9 (L) 32.3 - 55.7 %   MCV 89.7 80.0 - 100.0 fL   MCH 30.1 26.0 - 34.0 pg   MCHC 33.5 30.0 - 36.0 g/dL   RDW 32.2 02.5 - 42.7 %   Platelets 202 150 - 400 K/uL   nRBC 0.0 0.0 - 0.2 %    Assessment: Sue Moore is a 35 y.o. G3P0020 with an IUP at [redacted]w[redacted]d presenting for IOL for post dates. Cervix is long, closed, posterior; will start with vaginal cytotec.   Plan: #Labor: expectant management #Pain:  Per request #FWB Cat 1 #ID: GBS: neg #MOF:  breast #MOC: unsure #Circ: yes   Charlesetta Garibaldi Orvella Digiulio 08/19/2021, 1:58 AM

## 2021-08-19 NOTE — Anesthesia Procedure Notes (Signed)
Epidural Patient location during procedure: OB Start time: 08/19/2021 11:25 AM End time: 08/19/2021 11:35 AM  Staffing Anesthesiologist: Elmer Picker, MD Performed: anesthesiologist   Preanesthetic Checklist Completed: patient identified, IV checked, risks and benefits discussed, monitors and equipment checked, pre-op evaluation and timeout performed  Epidural Patient position: sitting Prep: DuraPrep and site prepped and draped Patient monitoring: continuous pulse ox, blood pressure, heart rate and cardiac monitor Approach: midline Location: L3-L4 Injection technique: LOR air  Needle:  Needle type: Tuohy  Needle gauge: 17 G Needle length: 9 cm Needle insertion depth: 4.5 cm Catheter type: closed end flexible Catheter size: 19 Gauge Catheter at skin depth: 10 cm Test dose: negative  Assessment Sensory level: T8 Events: blood not aspirated, injection not painful, no injection resistance, no paresthesia and negative IV test  Additional Notes Patient identified. Risks/Benefits/Options discussed with patient including but not limited to bleeding, infection, nerve damage, paralysis, failed block, incomplete pain control, headache, blood pressure changes, nausea, vomiting, reactions to medication both or allergic, itching and postpartum back pain. Confirmed with bedside nurse the patient's most recent platelet count. Confirmed with patient that they are not currently taking any anticoagulation, have any bleeding history or any family history of bleeding disorders. Patient expressed understanding and wished to proceed. All questions were answered. Sterile technique was used throughout the entire procedure. Please see nursing notes for vital signs. Test dose was given through epidural catheter and negative prior to continuing to dose epidural or start infusion. Warning signs of high block given to the patient including shortness of breath, tingling/numbness in hands, complete motor  block, or any concerning symptoms with instructions to call for help. Patient was given instructions on fall risk and not to get out of bed. All questions and concerns addressed with instructions to call with any issues or inadequate analgesia.  Reason for block:procedure for pain

## 2021-08-19 NOTE — Progress Notes (Signed)
Sue Moore is a 35 y.o. G3P0020 at [redacted]w[redacted]d by LMP admitted for induction of labor due to Post dates.  Subjective: Patient feeling more pain and pressure, vomiting. Patient and spouse requests to have cervical exam.    Objective: BP (!) 109/59    Pulse (!) 54    Temp 98.4 F (36.9 C) (Oral)    Resp 18    Ht 4' 11.06" (1.5 m)    Wt 67.1 kg    LMP 11/11/2020    BMI 29.82 kg/m  No intake/output data recorded. No intake/output data recorded.  FHT:  FHR: 125 bpm, variability: minimal ,  accelerations:  Present,  decelerations:  Absent UC:   regular, every 2-3 minutes SVE:   Dilation: 3 Effacement (%): 70 Station: -3 Exam by: R. Ruble Pumphrey,CNM [facility]  Labs: Lab Results  Component Value Date   WBC 6.6 08/19/2021   HGB 11.4 (L) 08/19/2021   HCT 34.0 (L) 08/19/2021   MCV 89.7 08/19/2021   PLT 202 08/19/2021    Assessment / Plan: Induction of labor due to postterm,  progressing well on pitocin  Labor: Progressing normally Preeclampsia:   n/a Fetal Wellbeing:  Category I Pain Control:  IV pain meds - advised to get epidural now - pt and spouse agrees with plan I/D:  n/a Anticipated MOD:  NSVD  Sue Moore, CNM 08/19/2021, 11:00 AM

## 2021-08-19 NOTE — Anesthesia Preprocedure Evaluation (Addendum)
Anesthesia Evaluation  Patient identified by MRN, date of birth, ID band Patient awake    Reviewed: Allergy & Precautions, NPO status , Patient's Chart, lab work & pertinent test results  Airway Mallampati: II  TM Distance: >3 FB Neck ROM: Full    Dental no notable dental hx.    Pulmonary neg pulmonary ROS, former smoker,    Pulmonary exam normal breath sounds clear to auscultation       Cardiovascular negative cardio ROS Normal cardiovascular exam Rhythm:Regular Rate:Normal     Neuro/Psych PSYCHIATRIC DISORDERS Anxiety Depression negative neurological ROS     GI/Hepatic negative GI ROS, (+)     substance abuse  alcohol use,   Endo/Other  negative endocrine ROS  Renal/GU negative Renal ROS  negative genitourinary   Musculoskeletal negative musculoskeletal ROS (+)   Abdominal   Peds  Hematology negative hematology ROS (+)   Anesthesia Other Findings IOL for postdates  Reproductive/Obstetrics (+) Pregnancy                            Anesthesia Physical Anesthesia Plan  ASA: 2  Anesthesia Plan: Epidural   Post-op Pain Management:    Induction:   PONV Risk Score and Plan: Treatment may vary due to age or medical condition  Airway Management Planned: Natural Airway  Additional Equipment:   Intra-op Plan:   Post-operative Plan:   Informed Consent: I have reviewed the patients History and Physical, chart, labs and discussed the procedure including the risks, benefits and alternatives for the proposed anesthesia with the patient or authorized representative who has indicated his/her understanding and acceptance.       Plan Discussed with: Anesthesiologist  Anesthesia Plan Comments: (Patient identified. Risks, benefits, options discussed with patient including but not limited to bleeding, infection, nerve damage, paralysis, failed block, incomplete pain control, headache,  blood pressure changes, nausea, vomiting, reactions to medication, itching, and post partum back pain. Confirmed with bedside nurse the patient's most recent platelet count. Confirmed with the patient that they are not taking any anticoagulation, have any bleeding history or any family history of bleeding disorders. Patient expressed understanding and wishes to proceed. All questions were answered. )        Anesthesia Quick Evaluation

## 2021-08-20 ENCOUNTER — Encounter (HOSPITAL_COMMUNITY): Payer: Self-pay | Admitting: Obstetrics & Gynecology

## 2021-08-20 DIAGNOSIS — O480 Post-term pregnancy: Secondary | ICD-10-CM

## 2021-08-20 DIAGNOSIS — Z3A40 40 weeks gestation of pregnancy: Secondary | ICD-10-CM

## 2021-08-20 MED ORDER — RHO D IMMUNE GLOBULIN 1500 UNIT/2ML IJ SOSY
300.0000 ug | PREFILLED_SYRINGE | Freq: Once | INTRAMUSCULAR | Status: AC
  Administered 2021-08-20: 13:00:00 300 ug via INTRAVENOUS
  Filled 2021-08-20: qty 2

## 2021-08-20 MED ORDER — ONDANSETRON HCL 4 MG PO TABS: 4.0000 mg | ORAL_TABLET | ORAL | Status: DC | PRN

## 2021-08-20 MED ORDER — SENNOSIDES-DOCUSATE SODIUM 8.6-50 MG PO TABS
2.0000 | ORAL_TABLET | ORAL | Status: DC
  Administered 2021-08-20 – 2021-08-21 (×2): 2 via ORAL
  Filled 2021-08-20 (×2): qty 2

## 2021-08-20 MED ORDER — COCONUT OIL OIL: 1.0000 "application " | TOPICAL_OIL | Status: DC | PRN

## 2021-08-20 MED ORDER — DIPHENHYDRAMINE HCL 25 MG PO CAPS: 25.0000 mg | ORAL_CAPSULE | Freq: Four times a day (QID) | ORAL | Status: DC | PRN

## 2021-08-20 MED ORDER — IBUPROFEN 600 MG PO TABS
600.0000 mg | ORAL_TABLET | Freq: Four times a day (QID) | ORAL | Status: DC
  Administered 2021-08-20 – 2021-08-21 (×5): 600 mg via ORAL
  Filled 2021-08-20 (×5): qty 1

## 2021-08-20 MED ORDER — TETANUS-DIPHTH-ACELL PERTUSSIS 5-2.5-18.5 LF-MCG/0.5 IM SUSY: 0.5000 mL | PREFILLED_SYRINGE | Freq: Once | INTRAMUSCULAR | Status: DC

## 2021-08-20 MED ORDER — BENZOCAINE-MENTHOL 20-0.5 % EX AERO
1.0000 "application " | INHALATION_SPRAY | CUTANEOUS | Status: DC | PRN
  Administered 2021-08-20: 1 via TOPICAL
  Filled 2021-08-20: qty 56

## 2021-08-20 MED ORDER — ZOLPIDEM TARTRATE 5 MG PO TABS: 5.0000 mg | ORAL_TABLET | Freq: Every evening | ORAL | Status: DC | PRN

## 2021-08-20 MED ORDER — ACETAMINOPHEN 325 MG PO TABS
650.0000 mg | ORAL_TABLET | ORAL | Status: DC | PRN
  Administered 2021-08-20 (×2): 650 mg via ORAL
  Filled 2021-08-20 (×2): qty 2

## 2021-08-20 MED ORDER — DIBUCAINE (PERIANAL) 1 % EX OINT: 1.0000 "application " | TOPICAL_OINTMENT | CUTANEOUS | Status: DC | PRN

## 2021-08-20 MED ORDER — FAMOTIDINE 20 MG PO TABS
20.0000 mg | ORAL_TABLET | Freq: Every day | ORAL | Status: DC
  Administered 2021-08-20 – 2021-08-21 (×2): 20 mg via ORAL
  Filled 2021-08-20 (×2): qty 1

## 2021-08-20 MED ORDER — ONDANSETRON HCL 4 MG/2ML IJ SOLN: 4.0000 mg | INTRAMUSCULAR | Status: DC | PRN

## 2021-08-20 MED ORDER — WITCH HAZEL-GLYCERIN EX PADS: 1.0000 "application " | MEDICATED_PAD | CUTANEOUS | Status: DC | PRN

## 2021-08-20 MED ORDER — SIMETHICONE 80 MG PO CHEW: 80.0000 mg | CHEWABLE_TABLET | ORAL | Status: DC | PRN

## 2021-08-20 MED ORDER — CALCIUM CARBONATE ANTACID 500 MG PO CHEW
400.0000 mg | CHEWABLE_TABLET | Freq: Three times a day (TID) | ORAL | Status: DC | PRN
  Administered 2021-08-20: 10:00:00 400 mg via ORAL
  Filled 2021-08-20: qty 2

## 2021-08-20 MED ORDER — PRENATAL MULTIVITAMIN CH
1.0000 | ORAL_TABLET | Freq: Every day | ORAL | Status: DC
  Administered 2021-08-20 – 2021-08-21 (×2): 1 via ORAL
  Filled 2021-08-20 (×2): qty 1

## 2021-08-20 NOTE — Anesthesia Postprocedure Evaluation (Signed)
Anesthesia Post Note  Patient: Sue Moore  Procedure(s) Performed: AN AD HOC LABOR EPIDURAL     Patient location during evaluation: Mother Baby Anesthesia Type: Epidural Level of consciousness: awake and alert and oriented Pain management: satisfactory to patient Vital Signs Assessment: post-procedure vital signs reviewed and stable Respiratory status: respiratory function stable Cardiovascular status: stable Postop Assessment: no headache, no backache, epidural receding, patient able to bend at knees, no signs of nausea or vomiting, adequate PO intake and able to ambulate Anesthetic complications: no   No notable events documented.  Last Vitals:  Vitals:   08/20/21 0245 08/20/21 0330  BP: 123/87 (!) 98/56  Pulse: 65 62  Resp: 17 16  Temp:  37.1 C  SpO2:      Last Pain:  Vitals:   08/20/21 0430  TempSrc:   PainSc: 5    Pain Goal:                   Kelbie Moro

## 2021-08-20 NOTE — Lactation Note (Signed)
This note was copied from a baby's chart. Lactation Consultation Note  Patient Name: Sue Moore [id number] Date: 08/20/2021 Reason for consult: Initial assessment;Primapara;1st time breastfeeding;Term Age:36 hours  Visited with mom of 11 hours old FT female, she's a P1 and reported (+++) breast changes during the pregnancy. Offered assistance with latch but mom voiced baby has already fed at the breast. Asked mom to call for assistance when needed.  Assisted with hand expression and pumping per her request, she has a great supply. RN Michelle Nasuti provided breastmilk labels for her. Reviewed normal newborn behavior, cluster feeding, feeding cues and size of baby's stomach. LC also provided a couple of pump recommendations; she's planning on purchasing a DEBP for home use.  Maternal Data Has patient been taught Hand Expression?: Yes Does the patient have breastfeeding experience prior to this delivery?: No  Feeding Mother's Current Feeding Choice: Breast Milk  LATCH Score Latch: Grasps breast easily, tongue down, lips flanged, rhythmical sucking.  Audible Swallowing: A few with stimulation  Type of Nipple: Everted at rest and after stimulation  Comfort (Breast/Nipple): Soft / non-tender  Hold (Positioning): Assistance needed to correctly position infant at breast and maintain latch.  LATCH Score: 8  Lactation Tools Discussed/Used Tools: Flanges;Pump Flange Size: 27 Breast pump type: Manual Pump Education: Setup, frequency, and cleaning;Milk Storage Reason for Pumping: mother's request to take home Pumping frequency: as needed for supplementation/mother's choice Pumped volume: 10 mL  Interventions Interventions: Breast feeding basics reviewed;Breast massage;Hand express;Hand pump;Education;LC Services brochure  Plan of care  Encouraged mom to continue putting baby to breast 8-12 times/24 hours or sooner if feeding cues are present Hand expression/pumping and  spoon feeding were also encouraged  FOB present and supportive. All questions and concerns answered, parents to call LC PRN.  Discharge Pump: Manual  Consult Status Consult Status: Follow-up Date: 08/21/21 Follow-up type: In-patient   Jammal Sarr Venetia Constable 08/20/2021, 12:40 PM

## 2021-08-20 NOTE — Discharge Summary (Signed)
Postpartum Discharge Summary     Patient Name: Sue Moore DOB: July 01, 1986 MRN: [id number]  Date of admission: 08/19/2021 Delivery date:08/20/2021  Delivering provider: Gavin Pound  Date of discharge: 08/21/2021  Admitting diagnosis: Post term pregnancy over 40 weeks [O48.0] Intrauterine pregnancy: [redacted]w[redacted]d    Secondary diagnosis:  Principal Problem:   Vaginal delivery Active Problems:   Rh negative status during pregnancy   Multigravida of advanced maternal age in third trimester   Post term pregnancy over 40 weeks   Second degree perineal laceration during delivery  Additional problems: None    Discharge diagnosis: Term Pregnancy Delivered                                              Post partum procedures: Rhogam given Augmentation: AROM, Pitocin, and Cytotec Complications: None  Hospital course: Induction of Labor With Vaginal Delivery   35y.o. yo G3P0020 at 46w2das admitted to the hospital 08/19/2021 for induction of labor.  Indication for induction: Postdates.  Patient had an uncomplicated labor course as follows: Patient was admitted for induction and was given one dose of Cytotec before being started on Pitocin.  She received an epidural and was AROM'd at 7.5 cm.  She then progressed to complete and delivered with staff and SO support. After delivery, repair of 2nd degree perineal laceration was necessary.  Membrane Rupture Time/Date: 6:16 PM ,08/19/2021   Delivery Method:Vaginal, Spontaneous  Episiotomy: None  Lacerations:  2nd degree  Details of delivery can be found in separate delivery note.  Patient had a routine postpartum course. She is meeting all milestones. She is eating, drinking, ambulating, and voiding without issue. Her pain and bleeding is controlled. She is stable for discharge with follow up in 4-6 weeks for her postpartum visit. Patient is discharged home 08/21/21.  Newborn Data: Birth date:08/20/2021  Birth time:1:25 AM   Gender:Female  Living status:Living  Apgars:8 ,9  Weight:3317 g   Magnesium Sulfate received: No BMZ received: No Rhophylac: Yes - given postpartum MMR: N/A T-DaP: Given prenatally Flu: Given prenatally  Transfusion: No  Physical exam  Vitals:   08/20/21 1235 08/20/21 1740 08/20/21 2027 08/21/21 0600  BP: 113/64 (!) 117/46 106/65 (!) 96/51  Pulse: (!) 53 63 76 61  Resp: 16 18 18 17   Temp: 98.5 F (36.9 C) 98.6 F (37 C) 97.8 F (36.6 C) 97.9 F (36.6 C)  TempSrc: Oral Oral Oral Oral  SpO2: 99% 99%    Weight:      Height:       General: alert, cooperative, and no distress Lochia: appropriate Uterine Fundus: firm and below umbilicus  DVT Evaluation: trace lower extremity edema to ankles bilaterally, no calf tenderness to palpation  Labs: Lab Results  Component Value Date   WBC 6.6 08/19/2021   HGB 11.4 (L) 08/19/2021   HCT 34.0 (L) 08/19/2021   MCV 89.7 08/19/2021   PLT 202 08/19/2021   CMP Latest Ref Rng & Units 02/07/2021  Glucose 65 - 99 mg/dL 82  BUN 6 - 20 mg/dL 6  Creatinine 0.57 - 1.00 mg/dL 0.59  Sodium 134 - 144 mmol/L 138  Potassium 3.5 - 5.2 mmol/L 4.4  Chloride 96 - 106 mmol/L 102  CO2 20 - 29 mmol/L 19(L)  Calcium 8.7 - 10.2 mg/dL 10.2  Total Protein 6.0 - 8.5 g/dL 7.5  Total Bilirubin 0.0 - 1.2 mg/dL 0.5  Alkaline Phos 44 - 121 IU/L 49  AST 0 - 40 IU/L 14  ALT 0 - 32 IU/L 14   Edinburgh Score: No flowsheet data found.   After visit meds:  Allergies as of 08/21/2021   No Known Allergies      Medication List     STOP taking these medications    cyclobenzaprine 10 MG tablet Commonly known as: FLEXERIL   pantoprazole 20 MG tablet Commonly known as: Protonix       TAKE these medications    acetaminophen 500 MG tablet Commonly known as: TYLENOL Take 2 tablets (1,000 mg total) by mouth every 8 (eight) hours as needed (pain).   ibuprofen 600 MG tablet Commonly known as: ADVIL Take 1 tablet (600 mg total) by mouth every 6  (six) hours as needed (pain).   multivitamin-prenatal 27-0.8 MG Tabs tablet Take 1 tablet by mouth daily at 12 noon.         Discharge home in stable condition Infant Feeding: Breast Infant Disposition:home with mother Discharge instruction: per After Visit Summary and Postpartum booklet. Activity: Advance as tolerated. Pelvic rest for 6 weeks.  Diet: routine diet Future Appointments: Future Appointments  Date Time Provider [place]  10/01/2021  1:50 PM Anyanwu, Sallyanne Havers, MD [facility]   Follow up Visit: Message sent 12/20 at 0230  Please schedule this patient for a In person postpartum visit in 6 weeks with the following provider: Any provider. Additional Postpartum F/U: None   Low risk pregnancy complicated by:  None Delivery mode:  Vaginal, Spontaneous  Anticipated Birth Control:  POPs, would like to start this at postpartum visit   08/21/2021 Genia Del, MD

## 2021-08-21 LAB — RH IG WORKUP (INCLUDES ABO/RH)
Fetal Screen: NEGATIVE
Gestational Age(Wks): 40
Unit division: 0

## 2021-08-21 MED ORDER — IBUPROFEN 600 MG PO TABS
600.0000 mg | ORAL_TABLET | Freq: Four times a day (QID) | ORAL | 0 refills | Status: AC | PRN
Start: 2021-08-21 — End: ?

## 2021-08-21 MED ORDER — ACETAMINOPHEN 500 MG PO TABS: 1000.0000 mg | ORAL_TABLET | Freq: Three times a day (TID) | ORAL | 0 refills | Status: AC | PRN

## 2021-08-21 NOTE — Lactation Note (Signed)
This note was copied from a baby's chart. Lactation Consultation Note  Patient Name: Sue Moore [id number] Date: 08/21/2021 Reason for consult: Follow-up assessment;Mother's request;Primapara;1st time breastfeeding;Term;Breastfeeding assistance Age:36 hours  Mom stated infant sleepy since circ., last urine noted 3 am this morning.   LC did some suck training to bring his tongue down, prior to latching at the breast.   Plan 1. To feed based on cues 8-12x 24hr period. Mom to offer breasts and look for signs of milk transfer.  2. Mom to supplement with EBM via slow flow nipple and pace bottle feeding 7-62ml  3. Mom to pump with manual q 3hrs for 10 min each breast.   All questions answered at the end of the visit.   Maternal Data Has patient been taught Hand Expression?: Yes  Feeding Mother's Current Feeding Choice: Breast Milk  LATCH Score Latch: Repeated attempts needed to sustain latch, nipple held in mouth throughout feeding, stimulation needed to elicit sucking reflex.  Audible Swallowing: Spontaneous and intermittent  Type of Nipple: Everted at rest and after stimulation  Comfort (Breast/Nipple): Soft / non-tender  Hold (Positioning): Assistance needed to correctly position infant at breast and maintain latch.  LATCH Score: 8   Lactation Tools Discussed/Used Tools: Pump;Flanges Flange Size: 27 Breast pump type: Manual Pump Education: Setup, frequency, and cleaning;Milk Storage Reason for Pumping: increase stimulation Pumping frequency: every 3 hrs for 15 min  Interventions Interventions: Breast feeding basics reviewed;Assisted with latch;Skin to skin;Breast massage;Hand express;Breast compression;Adjust position;Support pillows;Position options;Expressed milk;Hand pump;Education;Pace feeding;LC Psychologist, educational;Infant Driven Feeding Algorithm education  Discharge Discharge Education: Engorgement and breast care;Warning signs for feeding  baby Pump: Manual  Consult Status Consult Status: Complete Date: 08/21/21    Sue Moore  Sue Moore 08/21/2021, 1:38 PM

## 2021-08-21 NOTE — Clinical Social Work Maternal (Signed)
CLINICAL SOCIAL WORK MATERNAL/CHILD NOTE  Patient Details  Name: Sue Moore MRN: [id number] Date of Birth: [birth year]  Date:  08/21/2021  Clinical Social Worker Initiating Note:  Kathrin Greathouse, [place] Date/Time: Initiated:  08/21/21/      Child's Name:  Sue Moore   Biological Parents:  Mother, Father Sue Moore (12-02-1985), Sue Moore (08-12-2021))   Need for Interpreter:  Spanish   Reason for Referral:  Current Substance Use/Substance Use During Pregnancy  , Behavioral Health Concerns   Address:  [address] [place] [address]    Phone number:  [phone] (home)     Additional phone number:   Household Members/Support Persons (HM/SP):   Household Member/Support Person 1   HM/SP Name Relationship DOB or Age  HM/SP -1 Raelene Bott Spouse 12-02-1985  HM/SP -2        HM/SP -3        HM/SP -4        HM/SP -5        HM/SP -6        HM/SP -7        HM/SP -8          Natural Supports (not living in the home):  Friends, Immediate Family   Professional Supports: None   Employment: Unemployed   Type of Work:     Education:  Public librarian arranged:    Museum/gallery curator Resources:  Multimedia programmer    Other Resources:      Cultural/Religious Considerations Which May Impact Care:    Strengths:  Ability to meet basic needs  , Home prepared for child     Psychotropic Medications:   Vistaril     Pediatrician:       Pediatrician List:   [place]      Pediatrician Fax Number:    Risk Factors/Current Problems:  Substance Use     Cognitive State:  Alert  , Linear Thinking  , Able to Concentrate  , Insightful     Mood/Affect:  Calm  , Comfortable     CSW Assessment: CSW received consult for hx of Anxiety,Depression and THC use. CSW met with MOB to offer  support and complete assessment.    CSW met with MOB at bedside and introduced CSW role. CSW observed MOB lying on the couch and FOB lying in the bed STS with the infant. FOB reported he was giving "mom a break."  CSW praised FOB for his efforts. CSW offered MOB privacy however MOB reported that FOB was interpreting for her. MOB reported that CSW could share all information in front of her spouse. MOB confirmed the demographic information on hospital file is correct. MOB reported she and her spouse live together. MOB identified FOB, family and friends as supports. CSW inquired how MOB has felt since giving birth. MOB reported feeling "good but tired." MOB shared the L&D was "traumatizing but that she had a good experience." CSW inquired how MOB felt during the pregnancy. MOB reported during the first trimester she experienced anxiety and depression symptoms. MOB shared she does not have a clinical diagnosis of depression but she has had symptoms of anxiety in the past as it related to anorexia and social anxiety in her 5's. MOB reported she was prescribed Vistaril during the pregnancy for her symptoms however  she stopped taking the medication because it "knocked me out." MOB reported no history of therapy. MOB shared she does use coping strategies such as "trying to stay calm, walking a lot and painting." CSW encouraged MOB to continue implementing the coping the skills. CSW discussed PPD. MOB knowledgeable and receptive to the information about PPD. CSW provided education regarding the baby blues period vs. perinatal mood disorders, discussed treatment and gave resources for mental health follow up if concerns arise. CSW recommended MOB complete a self-evaluation during the postpartum time period using the New Mom Checklist from Postpartum Progress and encouraged MOB to contact a medical professional if symptoms are noted at any time. MOB denied SI/HI.   CSW inquired about MOB THC use during the pregnancy. MOB  shared that she used THC prior to pregnancy and used the Delta 8 pen during the pregnancy. MOB reported it helped with her anxiety and nausea symptoms. CSW informed MOB that infant's UDS was positive for [facility] and that CSW will make a report to [facility] CPS. MOB and FOB reported understanding and presented questions about the process. CSW answered questions about the process and both reported understanding. MOB reported she has essential items for the infant including a bassinet where the infant will sleep. MOB has chosen [facility] for the infant's follow up care. MOB reported interest in [facility] and gave CSW permission to help schedule the appointment. CSW scheduled the [facility] appointment for February 22 at 11:00am. CSW assessed MOB for additional need. MOB reported no further needs.   CSW provided review of Sudden Infant Death Syndrome (SIDS) precautions.    CSW identifies no further need for intervention and no barriers to discharge at this time.   CSW made a report to [facility] CPS. CSW will continue to monitor the infant's CDS.    CSW Plan/Description:  Sudden Infant Death Syndrome (SIDS) Education, Child Protective Service Report  , Hospital Drug Screen Policy Information, Perinatal Mood and Anxiety Disorder (PMADs) Education, No Further Intervention Required/No Barriers to Discharge    Lia Hopping, LCSW 08/21/2021, 11:56 AM

## 2021-08-31 ENCOUNTER — Telehealth (HOSPITAL_COMMUNITY): Payer: Self-pay

## 2021-08-31 NOTE — Telephone Encounter (Signed)
No answer. Left message to return nurse call.  Marcelino Duster [facility] 08/31/2021,1309

## 2021-10-01 ENCOUNTER — Encounter: Payer: Self-pay | Admitting: Obstetrics & Gynecology

## 2021-10-01 ENCOUNTER — Other Ambulatory Visit: Payer: Self-pay

## 2021-10-01 ENCOUNTER — Ambulatory Visit (INDEPENDENT_AMBULATORY_CARE_PROVIDER_SITE_OTHER): Payer: BC Managed Care – PPO | Admitting: Obstetrics & Gynecology

## 2021-10-01 NOTE — Progress Notes (Signed)
Post Partum Visit Note  Sue Moore is a 36 y.o. G78P1021 female who presents for a postpartum visit. She is 6 weeks postpartum following a normal spontaneous vaginal delivery.  I have fully reviewed the prenatal and intrapartum course. The delivery was at [redacted]w[redacted]d gestational weeks.  Anesthesia: epidural. Postpartum course has been uncomplicated. Baby is doing well. Baby is feeding by breast. Bleeding no bleeding. Bowel function is normal. Bladder function is normal. Patient is not sexually active. Contraception method is none. Postpartum depression screening: negative.   The pregnancy intention screening data noted above was reviewed. Potential methods of contraception were discussed. The patient elected to proceed with No data recorded.   Edinburgh Postnatal Depression Scale - 10/01/21 1400       Edinburgh Postnatal Depression Scale:  In the Past 7 Days   I have been able to laugh and see the funny side of things. 0    I have looked forward with enjoyment to things. 0    I have blamed myself unnecessarily when things went wrong. 0    I have been anxious or worried for no good reason. 0    I have felt scared or panicky for no good reason. 0    Things have been getting on top of me. 0    I have been so unhappy that I have had difficulty sleeping. 0    I have felt sad or miserable. 0    I have been so unhappy that I have been crying. 0    The thought of harming myself has occurred to me. 0    Edinburgh Postnatal Depression Scale Total 0             Health Maintenance Due  Topic Date Due   COVID-19 Vaccine (1) Never done   TETANUS/TDAP  Never done   INFLUENZA VACCINE  Never done    The following portions of the patient's history were reviewed and updated as appropriate: allergies, current medications, past family history, past medical history, past social history, past surgical history, and problem list.  Review of Systems Pertinent items noted in HPI and  remainder of comprehensive ROS otherwise negative.  Objective:  BP 105/69 (BP Location: Left Arm, Patient Position: Sitting, Cuff Size: Normal)    Pulse (!) 55    Ht 4' 11.06" (1.5 m)    Wt 111 lb 9.6 oz (50.6 kg)    LMP 11/11/2020    Breastfeeding Yes    BMI 22.50 kg/m    General:  alert and no distress   Breasts:  normal, done with chaperone present  Lungs: clear to auscultation bilaterally  Heart:  regular rate and rhythm  Abdomen: soft, non-tender; bowel sounds normal; no masses,  no organomegaly   GU exam:  not indicated       Assessment:   Postpartum care following vaginal delivery  Plan:   Essential components of care per ACOG recommendations:  1.  Mood and well being: Patient with negative depression screening today. Reviewed local resources for support.  - Patient tobacco use? No.   - hx of drug use? No.    2. Infant care and feeding:  -Patient currently breastmilk feeding? Yes. Reviewed importance of draining breast regularly to support lactation.  -Social determinants of health (SDOH) reviewed in EPIC. No concerns.  3. Sexuality, contraception and birth spacing - Patient does not want a pregnancy in the next year.  Unknown desired family size, - Reviewed forms of contraception in tiered  fashion. Patient desired condoms today.   - Discussed birth spacing of 18 months  4. Sleep and fatigue -Encouraged family/partner/community support of 4 hrs of uninterrupted sleep to help with mood and fatigue  5. Physical Recovery  - Discussed patients delivery and complications. She describes her labor as good. - Patient had a Vaginal, no problems at delivery. Patient had a 2nd degree laceration. Perineal healing reviewed. Patient expressed understanding - Patient has urinary incontinence? No. - Patient is safe to resume physical and sexual activity  6.  Health Maintenance - HM due items addressed Yes - Last pap smear  Diagnosis  Date Value Ref Range Status  02/07/2021    Final   - Negative for intraepithelial lesion or malignancy (NILM)   Pap smear not done at today's visit.  -Breast Cancer screening indicated? No.     Jaynie Collins, MD [facility] for [facility], [facility]

## 2022-06-17 IMAGING — US US OB < 14 WEEKS - US OB TV
1 series · 15 of 28 positions shown · non-contrast
Comparison: None

CLINICAL DATA: Pregnancy of unknown anatomic location, LMP
11/21/2020

EXAM:
OBSTETRIC <14 WK US AND TRANSVAGINAL OB US
TECHNIQUE: Both transabdominal and transvaginal ultrasound examinations were
performed for complete evaluation of the gestation as well as the
maternal uterus, adnexal regions, and pelvic cul-de-sac.
Transvaginal technique was performed to assess early pregnancy.

[Series 1: us ob < 14 weeks - us ob tv · 15 of 44 slices shown]
[im 1/44]
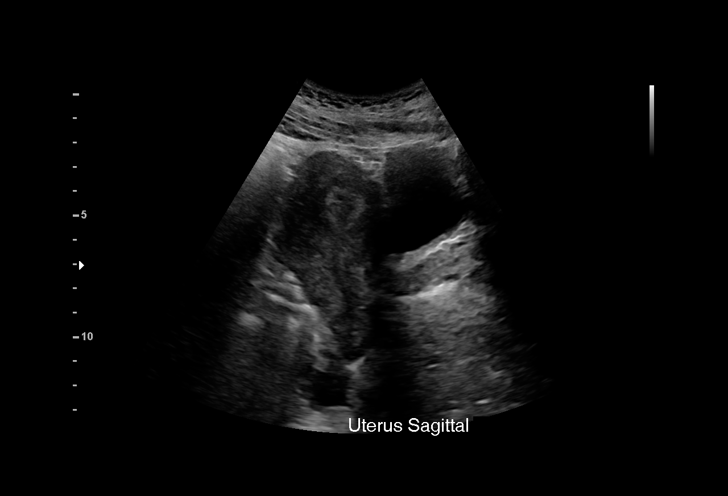
[im 4/44]
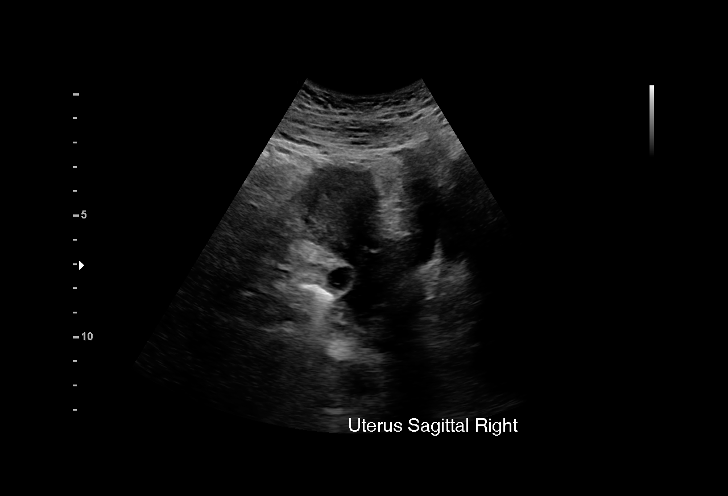
[im 7/44]
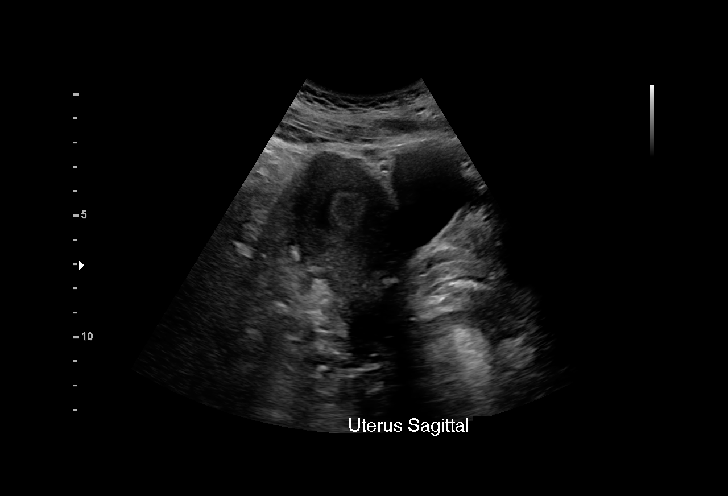
[im 10/44]
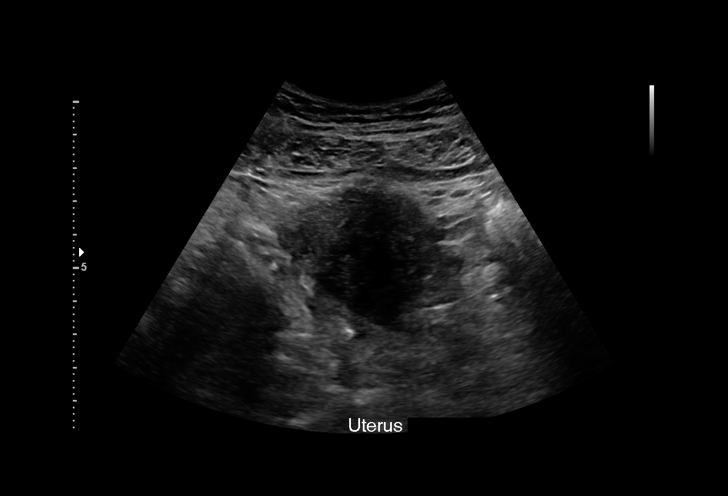
[im 13/44]
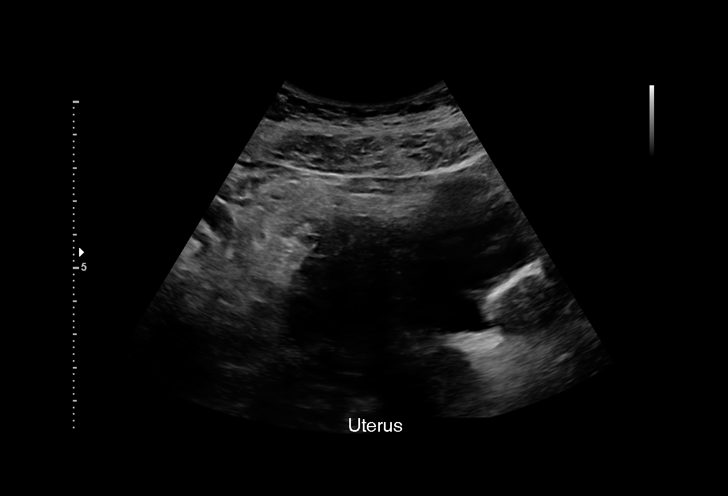
[im 16/44]
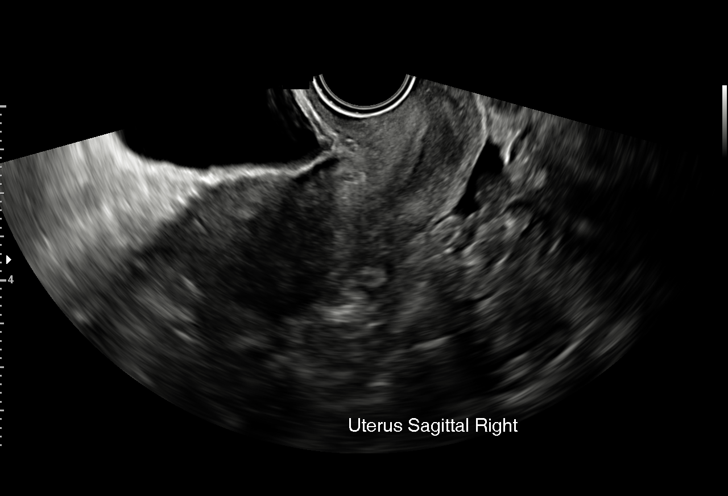
[im 20/44]
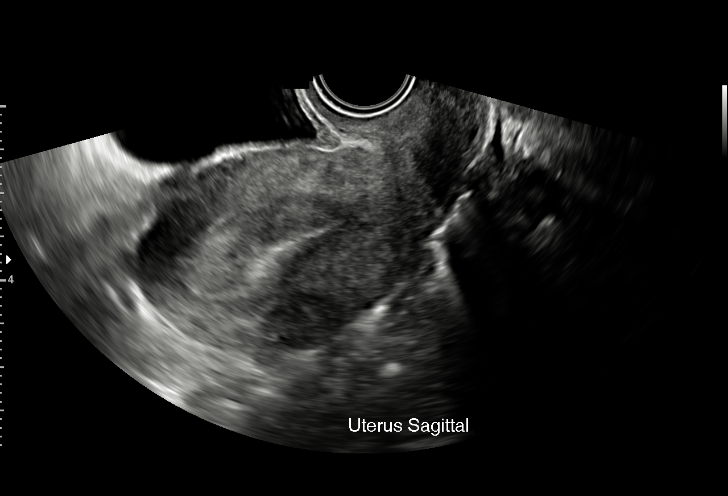
[im 23/44]
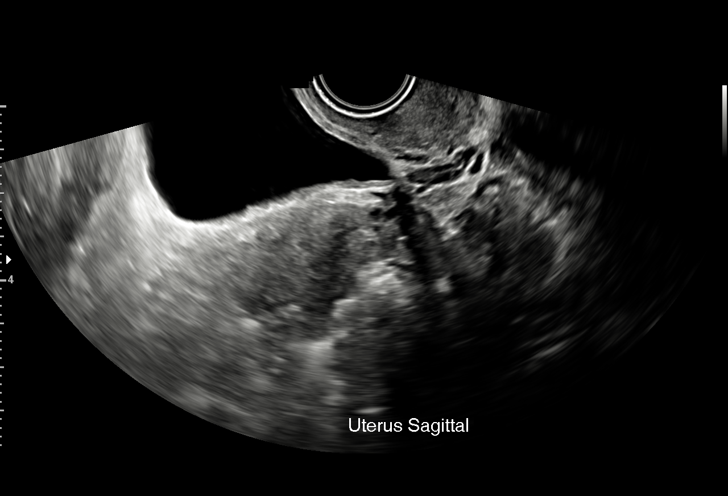
[im 24/44]
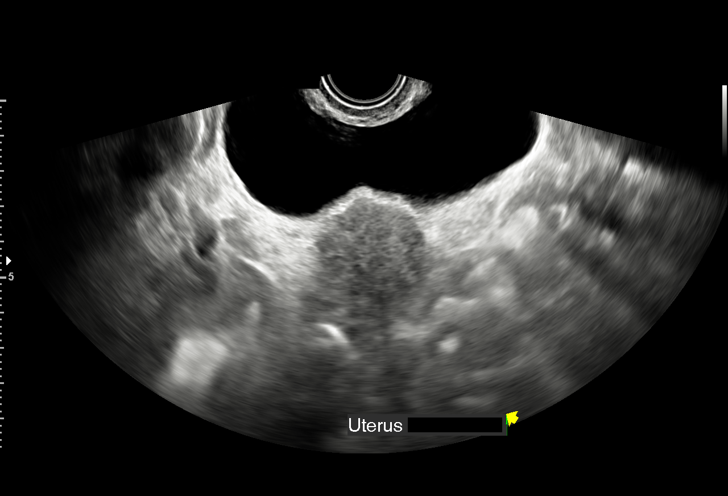
[im 28/44]
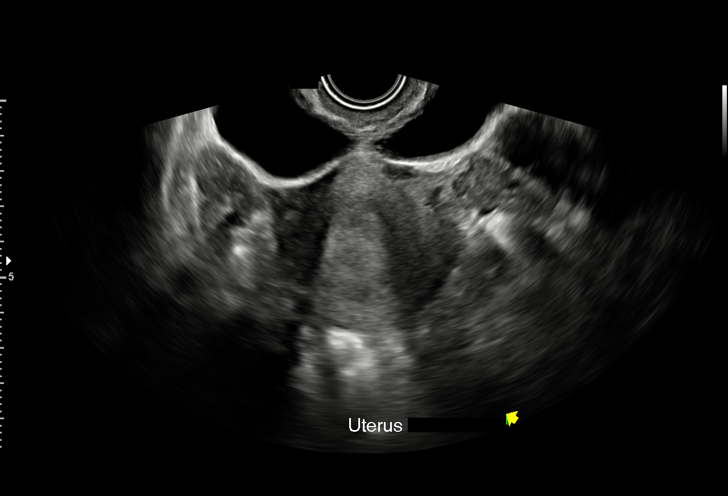
[im 31/44]
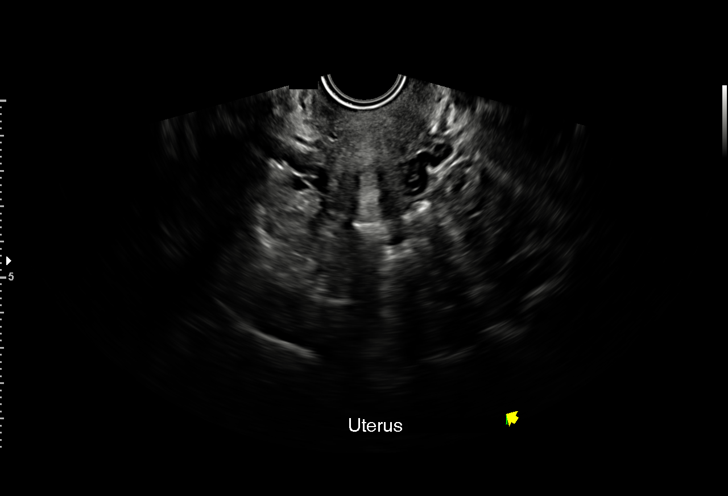
[im 34/44]
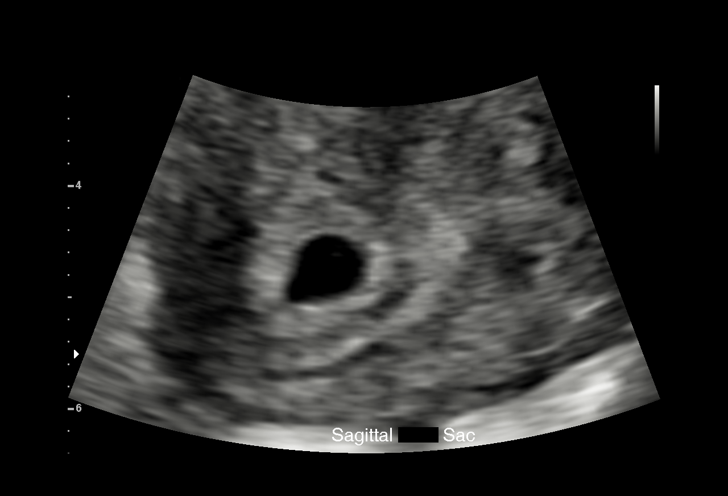
[im 37/44]
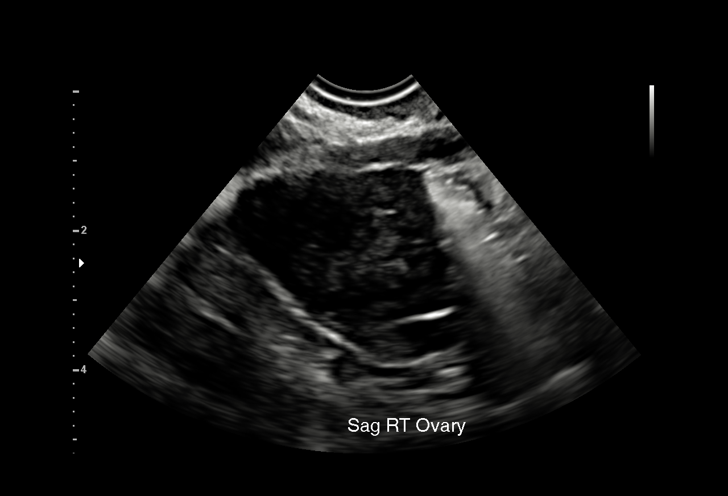
[im 40/44]
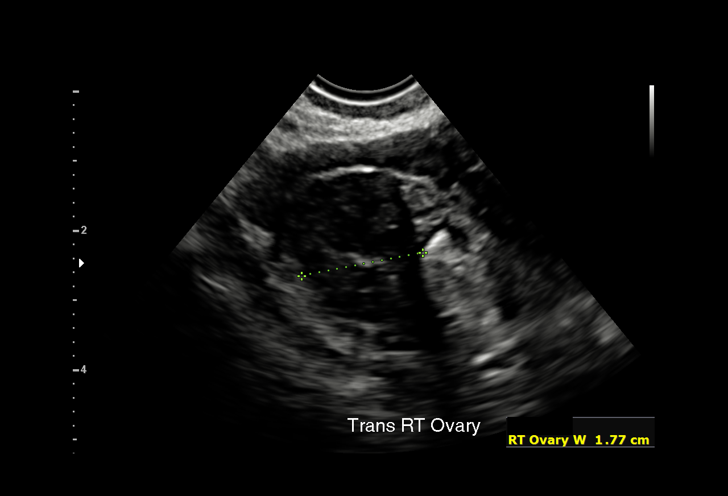
[im 44/44]
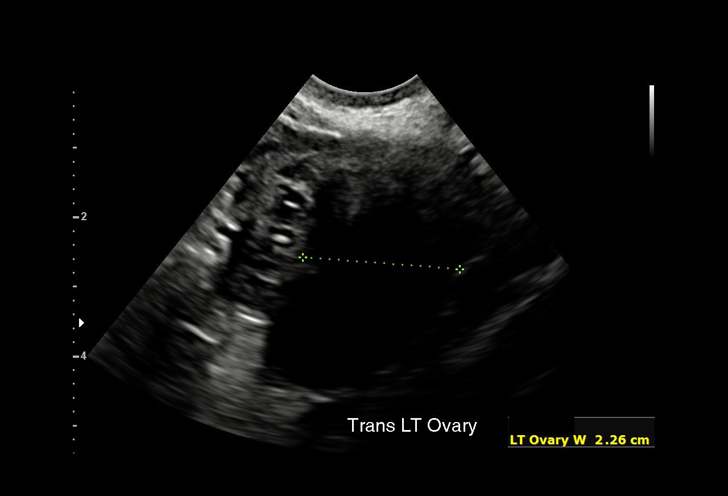

[15 of 28 positions shown; findings below may reference images not displayed]

FINDINGS: Intrauterine gestational sac: Present, single

Yolk sac:  Not identified

Embryo:  Not identified

Cardiac Activity: N/A

Heart Rate: N/A  bpm

MSD: 6.6 mm   5 w   2 d

Subchorionic hemorrhage:  None visualized.

Maternal uterus/adnexae:

Remainder of uterus unremarkable.

RIGHT ovary normal size and morphology 3.4 x 2.2 x 1.8 cm.

LEFT ovary normal size and morphology 3.7 x 2.1 x 2.3 cm.

Small amount of free pelvic fluid in cul-de-sac.

No adnexal masses.
IMPRESSION: Gestational sac within the uterus.

No fetal pole identified to establish viability; may consider
follow-up ultrasound in 14 days to establish viability if clinically
indicated.

Small amount of nonspecific free pelvic fluid without evidence of
adnexal mass.
